# Patient Record
Sex: Female | Born: 1990 | Hispanic: Yes | Marital: Married | Smoking: Never smoker
Health system: Southern US, Community
[De-identification: ages and names within clinical notes are randomized; demographics above are authoritative.]

## PROBLEM LIST (undated history)

## (undated) DIAGNOSIS — K219 Gastro-esophageal reflux disease without esophagitis: Secondary | ICD-10-CM

## (undated) DIAGNOSIS — Z789 Other specified health status: Secondary | ICD-10-CM

## (undated) DIAGNOSIS — K2970 Gastritis, unspecified, without bleeding: Secondary | ICD-10-CM

## (undated) HISTORY — PX: NO PAST SURGERIES: SHX2092

---

## 2016-09-21 LAB — OB RESULTS CONSOLE HIV ANTIBODY (ROUTINE TESTING): HIV: NONREACTIVE

## 2016-09-21 LAB — OB RESULTS CONSOLE RUBELLA ANTIBODY, IGM: RUBELLA: IMMUNE

## 2016-09-21 LAB — OB RESULTS CONSOLE GC/CHLAMYDIA
CHLAMYDIA, DNA PROBE: NEGATIVE
Gonorrhea: NEGATIVE

## 2016-09-21 LAB — OB RESULTS CONSOLE VARICELLA ZOSTER ANTIBODY, IGG: VARICELLA IGG: IMMUNE

## 2016-09-21 LAB — OB RESULTS CONSOLE HEPATITIS B SURFACE ANTIGEN: Hepatitis B Surface Ag: NEGATIVE

## 2016-09-21 LAB — OB RESULTS CONSOLE RPR: RPR: NONREACTIVE

## 2016-10-25 ENCOUNTER — Other Ambulatory Visit: Payer: Self-pay | Admitting: Family Medicine

## 2016-10-25 DIAGNOSIS — Z3480 Encounter for supervision of other normal pregnancy, unspecified trimester: Secondary | ICD-10-CM

## 2016-11-09 ENCOUNTER — Ambulatory Visit
Admission: RE | Admit: 2016-11-09 | Discharge: 2016-11-09 | Disposition: A | Discharge status: Home / Self Care | Payer: No Typology Code available for payment source | Source: Ambulatory Visit | Attending: Family Medicine | Admitting: Family Medicine

## 2016-11-09 DIAGNOSIS — Z3480 Encounter for supervision of other normal pregnancy, unspecified trimester: Secondary | ICD-10-CM

## 2016-11-09 DIAGNOSIS — Z3689 Encounter for other specified antenatal screening: Secondary | ICD-10-CM | POA: Diagnosis present

## 2016-11-09 DIAGNOSIS — Z3A18 18 weeks gestation of pregnancy: Secondary | ICD-10-CM | POA: Insufficient documentation

## 2016-11-09 DIAGNOSIS — O321XX0 Maternal care for breech presentation, not applicable or unspecified: Secondary | ICD-10-CM | POA: Insufficient documentation

## 2016-11-11 ENCOUNTER — Encounter: Payer: Self-pay | Admitting: *Deleted

## 2016-11-11 ENCOUNTER — Emergency Department: Payer: No Typology Code available for payment source

## 2016-11-11 ENCOUNTER — Emergency Department
Admission: EM | Admit: 2016-11-11 | Discharge: 2016-11-11 | Disposition: A | Discharge status: Home / Self Care | Payer: No Typology Code available for payment source | Attending: Emergency Medicine | Admitting: Emergency Medicine

## 2016-11-11 DIAGNOSIS — Z3A19 19 weeks gestation of pregnancy: Secondary | ICD-10-CM | POA: Diagnosis not present

## 2016-11-11 DIAGNOSIS — G44319 Acute post-traumatic headache, not intractable: Secondary | ICD-10-CM | POA: Insufficient documentation

## 2016-11-11 DIAGNOSIS — Y9389 Activity, other specified: Secondary | ICD-10-CM | POA: Insufficient documentation

## 2016-11-11 DIAGNOSIS — Y92410 Unspecified street and highway as the place of occurrence of the external cause: Secondary | ICD-10-CM | POA: Insufficient documentation

## 2016-11-11 DIAGNOSIS — S301XXA Contusion of abdominal wall, initial encounter: Secondary | ICD-10-CM | POA: Diagnosis not present

## 2016-11-11 DIAGNOSIS — Z79899 Other long term (current) drug therapy: Secondary | ICD-10-CM | POA: Insufficient documentation

## 2016-11-11 DIAGNOSIS — S161XXA Strain of muscle, fascia and tendon at neck level, initial encounter: Secondary | ICD-10-CM | POA: Insufficient documentation

## 2016-11-11 DIAGNOSIS — O9989 Other specified diseases and conditions complicating pregnancy, childbirth and the puerperium: Secondary | ICD-10-CM | POA: Insufficient documentation

## 2016-11-11 DIAGNOSIS — Y998 Other external cause status: Secondary | ICD-10-CM | POA: Diagnosis not present

## 2016-11-11 DIAGNOSIS — R1030 Lower abdominal pain, unspecified: Secondary | ICD-10-CM
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
## 2017-01-16 ENCOUNTER — Other Ambulatory Visit: Payer: Self-pay | Admitting: Advanced Practice Midwife

## 2017-01-16 DIAGNOSIS — Z3483 Encounter for supervision of other normal pregnancy, third trimester: Secondary | ICD-10-CM

## 2017-01-24 ENCOUNTER — Ambulatory Visit: Payer: Self-pay

## 2017-01-31 ENCOUNTER — Ambulatory Visit
Admission: RE | Admit: 2017-01-31 | Discharge: 2017-01-31 | Disposition: A | Discharge status: Home / Self Care | Payer: Self-pay | Source: Ambulatory Visit | Attending: Advanced Practice Midwife | Admitting: Advanced Practice Midwife

## 2017-01-31 DIAGNOSIS — Z3A30 30 weeks gestation of pregnancy: Secondary | ICD-10-CM | POA: Insufficient documentation

## 2017-01-31 DIAGNOSIS — Z3483 Encounter for supervision of other normal pregnancy, third trimester: Secondary | ICD-10-CM

## 2017-01-31 DIAGNOSIS — Z3403 Encounter for supervision of normal first pregnancy, third trimester: Secondary | ICD-10-CM | POA: Insufficient documentation

## 2017-03-14 LAB — OB RESULTS CONSOLE GBS: GBS: POSITIVE

## 2017-04-05 ENCOUNTER — Other Ambulatory Visit: Payer: Self-pay | Admitting: Obstetrics & Gynecology

## 2017-04-07 ENCOUNTER — Inpatient Hospital Stay
Admission: EM | Admit: 2017-04-07 | Discharge: 2017-04-13 | DRG: 786 | Disposition: A | Discharge status: Home / Self Care | Payer: Medicaid Other | Attending: Obstetrics and Gynecology | Admitting: Obstetrics and Gynecology

## 2017-04-07 ENCOUNTER — Other Ambulatory Visit: Payer: Self-pay

## 2017-04-07 ENCOUNTER — Encounter: Payer: Self-pay | Admitting: *Deleted

## 2017-04-07 DIAGNOSIS — O480 Post-term pregnancy: Secondary | ICD-10-CM | POA: Diagnosis present

## 2017-04-07 DIAGNOSIS — D62 Acute posthemorrhagic anemia: Secondary | ICD-10-CM | POA: Diagnosis not present

## 2017-04-07 DIAGNOSIS — O134 Gestational [pregnancy-induced] hypertension without significant proteinuria, complicating childbirth: Principal | ICD-10-CM | POA: Diagnosis present

## 2017-04-07 DIAGNOSIS — O163 Unspecified maternal hypertension, third trimester: Secondary | ICD-10-CM | POA: Diagnosis present

## 2017-04-07 DIAGNOSIS — O411290 Chorioamnionitis, unspecified trimester, not applicable or unspecified: Secondary | ICD-10-CM

## 2017-04-07 DIAGNOSIS — R112 Nausea with vomiting, unspecified: Secondary | ICD-10-CM

## 2017-04-07 DIAGNOSIS — O411230 Chorioamnionitis, third trimester, not applicable or unspecified: Secondary | ICD-10-CM | POA: Diagnosis present

## 2017-04-07 DIAGNOSIS — Z9889 Other specified postprocedural states: Secondary | ICD-10-CM

## 2017-04-07 DIAGNOSIS — O99824 Streptococcus B carrier state complicating childbirth: Secondary | ICD-10-CM | POA: Diagnosis present

## 2017-04-07 DIAGNOSIS — Z3A39 39 weeks gestation of pregnancy: Secondary | ICD-10-CM

## 2017-04-07 DIAGNOSIS — O9081 Anemia of the puerperium: Secondary | ICD-10-CM | POA: Diagnosis not present

## 2017-04-07 DIAGNOSIS — O368390 Maternal care for abnormalities of the fetal heart rate or rhythm, unspecified trimester, not applicable or unspecified: Secondary | ICD-10-CM

## 2017-04-07 HISTORY — DX: Other specified health status: Z78.9

## 2017-04-07 LAB — CBC
HCT: 33.3 % — ABNORMAL LOW (ref 35.0–47.0)
Hemoglobin: 11.3 g/dL — ABNORMAL LOW (ref 12.0–16.0)
MCH: 27.9 pg (ref 26.0–34.0)
MCHC: 33.9 g/dL (ref 32.0–36.0)
MCV: 82.1 fL (ref 80.0–100.0)
Platelets: 234 10*3/uL (ref 150–440)
RBC: 4.06 MIL/uL (ref 3.80–5.20)
RDW: 15.6 % — AB (ref 11.5–14.5)
WBC: 8.9 10*3/uL (ref 3.6–11.0)

## 2017-04-07 LAB — PROTEIN / CREATININE RATIO, URINE
Creatinine, Urine: 106 mg/dL
PROTEIN CREATININE RATIO: 0.13 mg/mg{creat} (ref 0.00–0.15)
Total Protein, Urine: 14 mg/dL

## 2017-04-07 LAB — COMPREHENSIVE METABOLIC PANEL
ALK PHOS: 173 U/L — AB (ref 38–126)
ALT: 9 U/L — ABNORMAL LOW (ref 14–54)
ANION GAP: 5 (ref 5–15)
AST: 22 U/L (ref 15–41)
Albumin: 2.9 g/dL — ABNORMAL LOW (ref 3.5–5.0)
BUN: 11 mg/dL (ref 6–20)
CALCIUM: 9 mg/dL (ref 8.9–10.3)
CO2: 23 mmol/L (ref 22–32)
Chloride: 103 mmol/L (ref 101–111)
Creatinine, Ser: 0.6 mg/dL (ref 0.44–1.00)
GFR calc non Af Amer: >60 mL/min (ref >60)
Glucose, Bld: 94 mg/dL (ref 65–99)
Potassium: 3.9 mmol/L (ref 3.5–5.1)
SODIUM: 131 mmol/L — AB (ref 135–145)
TOTAL PROTEIN: 6.6 g/dL (ref 6.5–8.1)
Total Bilirubin: 0.2 mg/dL — ABNORMAL LOW (ref 0.3–1.2)

## 2017-04-07 LAB — TYPE AND SCREEN
ABO/RH(D): O POS
ANTIBODY SCREEN: NEGATIVE

## 2017-04-07 MED ORDER — BUTORPHANOL TARTRATE 2 MG/ML IJ SOLN
1.0000 mg | INTRAMUSCULAR | Status: DC | PRN
Start: 2017-04-07 — End: 2017-04-09

## 2017-04-07 MED ORDER — LACTATED RINGERS IV SOLN
INTRAVENOUS | Status: DC
  Administered 2017-04-07 – 2017-04-09 (×5): via INTRAVENOUS

## 2017-04-07 MED ORDER — ACETAMINOPHEN 325 MG PO TABS: 650.0000 mg | ORAL_TABLET | ORAL | Status: DC | PRN

## 2017-04-07 MED ORDER — ACETAMINOPHEN 325 MG PO TABS
650.0000 mg | ORAL_TABLET | ORAL | Status: DC | PRN
  Administered 2017-04-08 – 2017-04-09 (×3): 650 mg via ORAL
  Filled 2017-04-07 (×3): qty 2

## 2017-04-07 MED ORDER — ONDANSETRON HCL 4 MG/2ML IJ SOLN
4.0000 mg | Freq: Four times a day (QID) | INTRAMUSCULAR | Status: DC | PRN
  Administered 2017-04-09: 4 mg via INTRAVENOUS
  Filled 2017-04-07: qty 2

## 2017-04-07 MED ORDER — PENICILLIN G POT IN DEXTROSE 60000 UNIT/ML IV SOLN
3.0000 10*6.[IU] | INTRAVENOUS | Status: DC
  Administered 2017-04-08 – 2017-04-09 (×9): 3 10*6.[IU] via INTRAVENOUS
  Filled 2017-04-07 (×21): qty 50

## 2017-04-07 MED ORDER — MISOPROSTOL 25 MCG QUARTER TABLET
25.0000 ug | ORAL_TABLET | ORAL | Status: DC | PRN
  Administered 2017-04-07 – 2017-04-08 (×2): 25 ug via VAGINAL
  Filled 2017-04-07 (×3): qty 1

## 2017-04-07 MED ORDER — OXYTOCIN 40 UNITS IN LACTATED RINGERS INFUSION - SIMPLE MED
2.5000 [IU]/h | INTRAVENOUS | Status: DC
  Filled 2017-04-07: qty 1000

## 2017-04-07 MED ORDER — LACTATED RINGERS IV SOLN
500.0000 mL | INTRAVENOUS | Status: DC | PRN
  Administered 2017-04-09 (×2): 500 mL via INTRAVENOUS

## 2017-04-07 MED ORDER — PENICILLIN G POTASSIUM 5000000 UNITS IJ SOLR
5.0000 10*6.[IU] | Freq: Once | INTRAVENOUS | Status: AC
  Administered 2017-04-07: 5 10*6.[IU] via INTRAVENOUS
  Filled 2017-04-07: qty 5

## 2017-04-07 MED ORDER — OXYTOCIN BOLUS FROM INFUSION: 500.0000 mL | Freq: Once | INTRAVENOUS | Status: DC

## 2017-04-07 MED ORDER — TERBUTALINE SULFATE 1 MG/ML IJ SOLN: 0.2500 mg | Freq: Once | INTRAMUSCULAR | Status: DC | PRN

## 2017-04-07 NOTE — H&P (Signed)
Obstetric H&P   Chief Complaint: Contractions  Prenatal Care Provider: [facility]  History of Present Illness: 26 y.o. G1P0 4895w6d by 18 week US derived EDD of 04/08/2017, presenting to L&D with irregular contractions, noted to have mild range BP's on presentation.  Patient was not found to be in labor at the time of admission, asymptomatic from BP standpoint without headaches, vision changes, RUQ or epigastric pain.  Does make note on her prenatal records of excessive weight gain of 50lbs during this pregnancy.  +FM, no LOF, no VB.   I have reviewed the dating for the pregnancy, and the dating was changed based on results of a 18 week ultrasound.  However, the discrepancy between the ultrasound and LMP was less than 7 days.  Which would make the patient 39 weeks but does not affect management.  US Criteria for Re-dating Pregnancy  Dating Dating Discrepancy  1368w6d >5 days  2670w0d - 5361w6d >7 days  7334w0d - 7472w6d >7 days  425w0d - 2629w6d >10 days  374w0d - 7334w6d >14 days  6174w0d and above >21 days   ACOG Committee Opinion 700 May 2017 "Methods for Estimating the Due Date"   Clinic [facility] Prenatal Labs  Dating  Blood type: O positive  Genetic Screen Quad:  Negative Antibody:Negative  Anatomic US  Rubella: Immune (05/04 0000) Varicella: Immune  GTT Early: 88; 28wk: 109 RPR: Nonreactive (05/04 0000)   Rhogam N/A HBsAg: Negative (05/04 0000)   TDaP vaccine 01/15/2017  Flu Shot:02/26/2017 HIV: Non-reactive (05/04 0000)   Baby Food Breast                              UJW:[id number]:Positive (10/25 0000)  Contraception Undecided Pap:no record  Support Person  Baseline P/C ratio 226     Review of Systems: 10 point review of systems negative unless otherwise noted in HPI  Past Medical History: Past Medical History:  Diagnosis Date  . Medical history non-contributory     Past Surgical History: Past Surgical History:  Procedure Laterality Date  . NO PAST SURGERIES      Family History: History  reviewed. No pertinent family history.  Social History: Social History   Socioeconomic History  . Marital status: Single    Spouse name: Not on file  . Number of children: Not on file  . Years of education: Not on file  . Highest education level: Not on file  Social Needs  . Financial resource strain: Not on file  . Food insecurity - worry: Not on file  . Food insecurity - inability: Not on file  . Transportation needs - medical: Not on file  . Transportation needs - non-medical: Not on file  Occupational History  . Not on file  Tobacco Use  . Smoking status: Never Smoker  . Smokeless tobacco: Never Used  Substance and Sexual Activity  . Alcohol use: No  . Drug use: No  . Sexual activity: Not on file  Other Topics Concern  . Not on file  Social History Narrative  . Not on file    Medications: Prior to Admission medications   Medication Sig Start Date End Date Taking? Authorizing Provider  acetaminophen (TYLENOL) 325 MG tablet Take 325 mg every 6 (six) hours as needed by mouth for mild pain.   Yes [provider]  Prenatal Vit-Fe Fumarate-FA (MULTIVITAMIN-PRENATAL) 27-0.8 MG TABS tablet Take 1 tablet by mouth daily at 12 noon.   Yes [provider]    Allergies: No Known Allergies  Physical Exam: Vitals: Blood pressure (!) 145/92, pulse 96, temperature 98.4 F (36.9 C), temperature source Oral, resp. rate 18, height 5\' 3"  (1.6 m), weight 213 lb (96.6 kg).  Urine Dip Protein: P?C pending  FHT: 120, moderate, +accels, no decels Toco: irritability  General: NAD HEENT: normocephalic, anicteric Pulmonary: No increased work of breathing Cardiovascular: RRR, distal pulses 2+ Abdomen: Gravid, non-tender Leopolds: vtx 8lbs Genitourinary:  Dilation: (posterior) Cervical Position: Posterior Presentation: Undeterminable Exam by:: LSE Extremities: no edema, erythema, or tenderness Neurologic: Grossly intact Psychiatric: mood appropriate, affect  full  Labs: Results for orders placed or performed during the hospital encounter of 04/07/17 (from the past 24 hour(s))  Protein / creatinine ratio, urine     Status: None   Collection Time: 04/07/17  7:57 PM  Result Value Ref Range   Creatinine, Urine 106 mg/dL   Total Protein, Urine 14 mg/dL   Protein Creatinine Ratio 0.13 0.00 - 0.15 mg/mg[Cre]    Assessment: 26 y.o. G1P0 9435w6d by 04/08/2017, with gestational hypertension  Plan: 1) Gestational hypertension at greater than 37 weeks - proceed with cytotec IOL  2) Fetus -  Cat I tracing  3) PNL - see HPI  4) Immunization History - TDAP and influenza up to date  5) Disposition - pending delivery

## 2017-04-08 ENCOUNTER — Inpatient Hospital Stay: Payer: Medicaid Other | Admitting: Anesthesiology

## 2017-04-08 ENCOUNTER — Encounter: Payer: Self-pay | Admitting: Anesthesiology

## 2017-04-08 MED ORDER — MISOPROSTOL 200 MCG PO TABS
ORAL_TABLET | ORAL | Status: AC
  Filled 2017-04-08: qty 4

## 2017-04-08 MED ORDER — DIPHENHYDRAMINE HCL 50 MG/ML IJ SOLN: 12.5000 mg | INTRAMUSCULAR | Status: DC | PRN

## 2017-04-08 MED ORDER — FENTANYL 2.5 MCG/ML W/ROPIVACAINE 0.15% IN NS 100 ML EPIDURAL (ARMC)
EPIDURAL | Status: DC | PRN
  Administered 2017-04-08: 12 mL/h via EPIDURAL

## 2017-04-08 MED ORDER — EPHEDRINE 5 MG/ML INJ: 10.0000 mg | INTRAVENOUS | Status: DC | PRN

## 2017-04-08 MED ORDER — LIDOCAINE HCL (PF) 1 % IJ SOLN
INTRAMUSCULAR | Status: AC
  Filled 2017-04-08: qty 30

## 2017-04-08 MED ORDER — OXYTOCIN 10 UNIT/ML IJ SOLN
INTRAMUSCULAR | Status: AC
  Filled 2017-04-08: qty 2

## 2017-04-08 MED ORDER — OXYTOCIN 40 UNITS IN LACTATED RINGERS INFUSION - SIMPLE MED
1.0000 m[IU]/min | INTRAVENOUS | Status: DC
  Administered 2017-04-08: 2 m[IU]/min via INTRAVENOUS
  Filled 2017-04-08: qty 1000

## 2017-04-08 MED ORDER — LIDOCAINE HCL (PF) 1 % IJ SOLN
INTRAMUSCULAR | Status: DC | PRN
  Administered 2017-04-08: 2 mL via SUBCUTANEOUS

## 2017-04-08 MED ORDER — PHENYLEPHRINE 40 MCG/ML (10ML) SYRINGE FOR IV PUSH (FOR BLOOD PRESSURE SUPPORT): 80.0000 ug | PREFILLED_SYRINGE | INTRAVENOUS | Status: DC | PRN

## 2017-04-08 MED ORDER — FENTANYL 2.5 MCG/ML W/ROPIVACAINE 0.15% IN NS 100 ML EPIDURAL (ARMC)
12.0000 mL/h | EPIDURAL | Status: DC
  Administered 2017-04-09 (×2): 12 mL/h via EPIDURAL
  Filled 2017-04-08 (×2): qty 100

## 2017-04-08 MED ORDER — AMMONIA AROMATIC IN INHA
RESPIRATORY_TRACT | Status: AC
  Filled 2017-04-08: qty 10

## 2017-04-08 MED ORDER — FENTANYL 2.5 MCG/ML W/ROPIVACAINE 0.15% IN NS 100 ML EPIDURAL (ARMC)
EPIDURAL | Status: AC
  Filled 2017-04-08: qty 100

## 2017-04-08 MED ORDER — BUPIVACAINE HCL (PF) 0.25 % IJ SOLN
INTRAMUSCULAR | Status: DC | PRN
  Administered 2017-04-08 (×2): 5 mL via EPIDURAL

## 2017-04-08 MED ORDER — TERBUTALINE SULFATE 1 MG/ML IJ SOLN: 0.2500 mg | Freq: Once | INTRAMUSCULAR | Status: DC | PRN

## 2017-04-08 MED ORDER — LIDOCAINE-EPINEPHRINE (PF) 1.5 %-1:200000 IJ SOLN
INTRAMUSCULAR | Status: DC | PRN
  Administered 2017-04-08: 3 mL via EPIDURAL

## 2017-04-08 MED ORDER — LACTATED RINGERS IV SOLN: 500.0000 mL | Freq: Once | INTRAVENOUS | Status: DC

## 2017-04-08 NOTE — Progress Notes (Signed)
Pt called out because she was leaking fluid when sitting on birthing ball. Nitrazine negative.

## 2017-04-08 NOTE — Progress Notes (Signed)
SVE performed. Updated pt on POC. Denies additional questions/needs at this time. Communication via Kandis CockingMaritza, Spanish interpretor.

## 2017-04-08 NOTE — Anesthesia Preprocedure Evaluation (Signed)
Anesthesia Evaluation  Patient identified by MRN, date of birth, ID band Patient awake    Reviewed: Allergy & Precautions, H&P , NPO status , Patient's Chart, lab work & pertinent test results, reviewed documented beta blocker date and time   History of Anesthesia Complications Negative for: history of anesthetic complications  Airway Mallampati: III  TM Distance: >3 FB Neck ROM: full    Dental  (+) Teeth Intact   Pulmonary neg pulmonary ROS,           Cardiovascular Exercise Tolerance: Good negative cardio ROS       Neuro/Psych negative neurological ROS  negative psych ROS   GI/Hepatic Neg liver ROS, GERD  ,  Endo/Other  negative endocrine ROS  Renal/GU negative Renal ROS  negative genitourinary   Musculoskeletal   Abdominal   Peds  Hematology negative hematology ROS (+)   Anesthesia Other Findings Past Medical History: No date: Medical history non-contributory   Reproductive/Obstetrics (+) Pregnancy                             Anesthesia Physical Anesthesia Plan  ASA: II  Anesthesia Plan: Epidural   Post-op Pain Management:    Induction:   PONV Risk Score and Plan:   Airway Management Planned:   Additional Equipment:   Intra-op Plan:   Post-operative Plan:   Informed Consent: I have reviewed the patients History and Physical, chart, labs and discussed the procedure including the risks, benefits and alternatives for the proposed anesthesia with the patient or authorized representative who has indicated his/her understanding and acceptance.   Dental Advisory Given  Plan Discussed with: Anesthesiologist, CRNA and Surgeon  Anesthesia Plan Comments:         Anesthesia Quick Evaluation

## 2017-04-08 NOTE — Progress Notes (Signed)
Introduced self to pt and  significant other and updated on plan of care for the day via [place] interpretor, StevensonLoyda. Pt demonstrated understanding of how to contact nursing staff (call bell and telephone), verbalized understanding of plan of care, and denied questions at this time.

## 2017-04-08 NOTE — Anesthesia Procedure Notes (Signed)
Epidural Patient location during procedure: OB Start time: 04/08/2017 10:53 PM End time: 04/08/2017 11:02 PM  Staffing Anesthesiologist: Lenard SimmerKarenz, Elisa Sorlie, MD Performed: anesthesiologist   Preanesthetic Checklist Completed: patient identified, site marked, surgical consent, pre-op evaluation, timeout performed, IV checked, risks and benefits discussed and monitors and equipment checked  Epidural Patient position: sitting Prep: ChloraPrep Patient monitoring: heart rate, continuous pulse ox and blood pressure Approach: midline Location: L3-L4 Injection technique: LOR saline  Needle:  Needle type: Tuohy  Needle gauge: 17 G Needle length: 9 cm and 9 Needle insertion depth: 5 cm Catheter type: closed end flexible Catheter size: 19 Gauge Catheter at skin depth: 10 cm Test dose: negative and 1.5% lidocaine with Epi 1:200 K  Assessment Sensory level: T10 Events: blood not aspirated, injection not painful, no injection resistance, negative IV test and no paresthesia  Additional Notes Pt. Evaluated and documentation done after procedure finished. Patient identified. Risks/Benefits/Options discussed with patient including but not limited to bleeding, infection, nerve damage, paralysis, failed block, incomplete pain control, headache, blood pressure changes, nausea, vomiting, reactions to medication both or allergic, itching and postpartum back pain. Confirmed with bedside nurse the patient's most recent platelet count. Confirmed with patient that they are not currently taking any anticoagulation, have any bleeding history or any family history of bleeding disorders. Patient expressed understanding and wished to proceed. All questions were answered. Sterile technique was used throughout the entire procedure. Please see nursing notes for vital signs. Test dose was given through epidural catheter and negative prior to continuing to dose epidural or start infusion. Warning signs of high block given  to the patient including shortness of breath, tingling/numbness in hands, complete motor block, or any concerning symptoms with instructions to call for help. Patient was given instructions on fall risk and not to get out of bed. All questions and concerns addressed with instructions to call with any issues or inadequate analgesia.   Patient tolerated the insertion well without immediate complications.Reason for block:procedure for pain

## 2017-04-08 NOTE — Progress Notes (Signed)
  Labor Progress Note   26 y.o. G1P0 @ 5058w0d , admitted for  Pregnancy, Labor Management. Induction for gestational hypertension  Subjective:  The contractions are feeling stronger.   Objective:  BP 131/78 (BP Location: Right Arm)   Pulse 88   Temp 98.7 F (37.1 C) (Axillary)   Resp 20   Ht 5\' 3"  (1.6 m)   Wt 213 lb (96.6 kg)   BMI 37.73 kg/m  Abd: mild Extr: trace to 1+ bilateral pedal edema SVE: CERVIX: 1.5 cm dilated, 70-80 effaced, -3 station, cervix swept, foley bulb placed  EFM: FHR: 130 bpm, variability: moderate,  accelerations:  Present,  decelerations:  Absent Toco: Frequency: Every 2-4 minutes Labs: I have reviewed the patient's lab results.   Assessment & Plan:  G1P0 @ 9358w0d, admitted for  Pregnancy and Labor/Delivery Management  1. Pain management: none. 2. FWB: FHT category I.  3. ID: GBS positive 4. Labor management: foley bulb, sweep, continue pitocin  All discussed with patient through interpreter, see orders  Tresea MallJane Zilpha Mcandrew, CNM  [facility], [facility] 04/08/2017  2:48 PM

## 2017-04-08 NOTE — Progress Notes (Signed)
  Labor Progress Note   26 y.o. G1P0 @ 629w0d , admitted for  Pregnancy, Labor Management. Induction for gestational hypertension  Subjective:  Coping well with contractions- rating pain 8/10  Objective:  BP 135/70 (BP Location: Right Arm)   Pulse 84   Temp 98.3 F (36.8 C) (Oral)   Resp 18   Ht 5\' 3"  (1.6 m)   Wt 213 lb (96.6 kg)   BMI 37.73 kg/m  Abd: mild Extr: trace to 1+ bilateral pedal edema SVE per RN E. Rivera: CERVIX: 2.5 cm dilated, 80 effaced, -3 station  EFM: FHR: 125 bpm, variability: moderate,  accelerations:  Present,  decelerations:  Absent Toco: Frequency: Every 2-3 minutes Labs: I have reviewed the patient's lab results.   Assessment & Plan:  G1P0 @ 1229w0d, admitted for  Pregnancy and Labor/Delivery Management  1. Pain management: none. 2. FWB: FHT category I.  3. ID: GBS positive Penicillin prophylaxis 4. Labor management: Continue with pitocin, AROM with lower station  All discussed with patient, see orders  Tresea MallJane Madinah Quarry, CNM  [facility], [facility] 04/08/2017  7:41 PM

## 2017-04-09 ENCOUNTER — Other Ambulatory Visit: Payer: Self-pay

## 2017-04-09 ENCOUNTER — Encounter
Admission: EM | Disposition: A | Discharge status: Home / Self Care | Payer: Self-pay | Source: Home / Self Care | Attending: Obstetrics and Gynecology

## 2017-04-09 DIAGNOSIS — O368390 Maternal care for abnormalities of the fetal heart rate or rhythm, unspecified trimester, not applicable or unspecified: Secondary | ICD-10-CM

## 2017-04-09 DIAGNOSIS — O134 Gestational [pregnancy-induced] hypertension without significant proteinuria, complicating childbirth: Secondary | ICD-10-CM

## 2017-04-09 DIAGNOSIS — O411290 Chorioamnionitis, unspecified trimester, not applicable or unspecified: Secondary | ICD-10-CM

## 2017-04-09 DIAGNOSIS — Z3A39 39 weeks gestation of pregnancy: Secondary | ICD-10-CM

## 2017-04-09 HISTORY — DX: Chorioamnionitis, unspecified trimester, not applicable or unspecified: O41.1290

## 2017-04-09 LAB — RPR: RPR Ser Ql: NONREACTIVE

## 2017-04-09 LAB — COMPREHENSIVE METABOLIC PANEL
ALBUMIN: 2.5 g/dL — AB (ref 3.5–5.0)
ALK PHOS: 187 U/L — AB (ref 38–126)
ALT: 9 U/L — AB (ref 14–54)
ANION GAP: 9 (ref 5–15)
AST: 20 U/L (ref 15–41)
BUN: 9 mg/dL (ref 6–20)
CALCIUM: 9 mg/dL (ref 8.9–10.3)
CHLORIDE: 102 mmol/L (ref 101–111)
CO2: 21 mmol/L — AB (ref 22–32)
CREATININE: 0.57 mg/dL (ref 0.44–1.00)
GFR calc Af Amer: >60 mL/min (ref >60)
GFR calc non Af Amer: >60 mL/min (ref >60)
GLUCOSE: 80 mg/dL (ref 65–99)
Potassium: 4.1 mmol/L (ref 3.5–5.1)
SODIUM: 132 mmol/L — AB (ref 135–145)
Total Bilirubin: 0.5 mg/dL (ref 0.3–1.2)
Total Protein: 6.2 g/dL — ABNORMAL LOW (ref 6.5–8.1)

## 2017-04-09 LAB — CBC
HCT: 34.6 % — ABNORMAL LOW (ref 35.0–47.0)
HEMOGLOBIN: 11.2 g/dL — AB (ref 12.0–16.0)
MCH: 27.7 pg (ref 26.0–34.0)
MCHC: 32.4 g/dL (ref 32.0–36.0)
MCV: 85.3 fL (ref 80.0–100.0)
Platelets: 217 10*3/uL (ref 150–440)
RBC: 4.05 MIL/uL (ref 3.80–5.20)
RDW: 17.1 % — ABNORMAL HIGH (ref 11.5–14.5)
WBC: 8.4 10*3/uL (ref 3.6–11.0)

## 2017-04-09 SURGERY — Surgical Case
Anesthesia: Choice

## 2017-04-09 MED ORDER — OXYTOCIN 40 UNITS IN LACTATED RINGERS INFUSION - SIMPLE MED
2.5000 [IU]/h | INTRAVENOUS | Status: AC
  Filled 2017-04-09: qty 1000

## 2017-04-09 MED ORDER — SUGAMMADEX SODIUM 200 MG/2ML IV SOLN
INTRAVENOUS | Status: DC | PRN
  Administered 2017-04-09: 200 mg via INTRAVENOUS

## 2017-04-09 MED ORDER — SODIUM CHLORIDE 0.9 % IJ SOLN
INTRAMUSCULAR | Status: AC
  Filled 2017-04-09: qty 50

## 2017-04-09 MED ORDER — OXYTOCIN 10 UNIT/ML IJ SOLN
INTRAMUSCULAR | Status: AC
  Filled 2017-04-09: qty 6

## 2017-04-09 MED ORDER — MENTHOL 3 MG MT LOZG
1.0000 | LOZENGE | OROMUCOSAL | Status: DC | PRN
  Filled 2017-04-09: qty 9

## 2017-04-09 MED ORDER — MIDAZOLAM HCL 2 MG/2ML IJ SOLN
INTRAMUSCULAR | Status: AC
  Filled 2017-04-09: qty 2

## 2017-04-09 MED ORDER — CEFAZOLIN SODIUM-DEXTROSE 2-4 GM/100ML-% IV SOLN: 2.0000 g | INTRAVENOUS | Status: DC

## 2017-04-09 MED ORDER — EPHEDRINE SULFATE 50 MG/ML IJ SOLN
INTRAMUSCULAR | Status: DC | PRN
  Administered 2017-04-09: 10 mg via INTRAVENOUS

## 2017-04-09 MED ORDER — FENTANYL CITRATE (PF) 250 MCG/5ML IJ SOLN
INTRAMUSCULAR | Status: AC
  Filled 2017-04-09: qty 5

## 2017-04-09 MED ORDER — SODIUM CHLORIDE 0.9 % IJ SOLN
INTRAMUSCULAR | Status: DC | PRN
  Administered 2017-04-09: 50 mL via INTRAVENOUS

## 2017-04-09 MED ORDER — SIMETHICONE 80 MG PO CHEW
80.0000 mg | CHEWABLE_TABLET | Freq: Three times a day (TID) | ORAL | Status: DC
  Administered 2017-04-10 – 2017-04-13 (×11): 80 mg via ORAL
  Filled 2017-04-09 (×11): qty 1

## 2017-04-09 MED ORDER — FENTANYL CITRATE (PF) 100 MCG/2ML IJ SOLN
INTRAMUSCULAR | Status: AC
  Filled 2017-04-09: qty 2

## 2017-04-09 MED ORDER — CARBOPROST TROMETHAMINE 250 MCG/ML IM SOLN
INTRAMUSCULAR | Status: AC
  Administered 2017-04-09: 250 ug
  Filled 2017-04-09: qty 1

## 2017-04-09 MED ORDER — MORPHINE SULFATE (PF) 0.5 MG/ML IJ SOLN
INTRAMUSCULAR | Status: AC
  Filled 2017-04-09: qty 10

## 2017-04-09 MED ORDER — OXYTOCIN 40 UNITS IN LACTATED RINGERS INFUSION - SIMPLE MED
INTRAVENOUS | Status: DC | PRN
  Administered 2017-04-09: 1 mL via INTRAVENOUS
  Administered 2017-04-09: 299 mL via INTRAVENOUS
  Administered 2017-04-09: 500 mL via INTRAVENOUS

## 2017-04-09 MED ORDER — ZOLPIDEM TARTRATE 5 MG PO TABS: 5.0000 mg | ORAL_TABLET | Freq: Every evening | ORAL | Status: DC | PRN

## 2017-04-09 MED ORDER — ACETAMINOPHEN 325 MG PO TABS
650.0000 mg | ORAL_TABLET | ORAL | Status: DC | PRN
  Administered 2017-04-11 – 2017-04-12 (×3): 650 mg via ORAL
  Filled 2017-04-09 (×3): qty 2

## 2017-04-09 MED ORDER — OXYCODONE-ACETAMINOPHEN 5-325 MG PO TABS
2.0000 | ORAL_TABLET | ORAL | Status: DC | PRN
  Administered 2017-04-10 – 2017-04-11 (×5): 2 via ORAL
  Filled 2017-04-09 (×4): qty 2

## 2017-04-09 MED ORDER — BUPIVACAINE HCL (PF) 0.5 % IJ SOLN
INTRAMUSCULAR | Status: DC | PRN
  Administered 2017-04-09: 5 mL

## 2017-04-09 MED ORDER — LACTATED RINGERS IV SOLN
INTRAVENOUS | Status: DC
  Administered 2017-04-09 – 2017-04-10 (×3): via INTRAVENOUS

## 2017-04-09 MED ORDER — ACETAMINOPHEN 500 MG PO TABS
ORAL_TABLET | ORAL | Status: AC
  Filled 2017-04-09: qty 2

## 2017-04-09 MED ORDER — SENNOSIDES-DOCUSATE SODIUM 8.6-50 MG PO TABS
2.0000 | ORAL_TABLET | ORAL | Status: DC
  Administered 2017-04-10 – 2017-04-13 (×4): 2 via ORAL
  Filled 2017-04-09 (×4): qty 2

## 2017-04-09 MED ORDER — BUPIVACAINE HCL (PF) 0.5 % IJ SOLN
INTRAMUSCULAR | Status: AC
  Administered 2017-04-09: 20:00:00
  Filled 2017-04-09: qty 30

## 2017-04-09 MED ORDER — GENTAMICIN SULFATE 40 MG/ML IJ SOLN
5.0000 mg/kg | INTRAVENOUS | Status: DC
  Administered 2017-04-09: 480 mg via INTRAVENOUS
  Filled 2017-04-09: qty 12

## 2017-04-09 MED ORDER — FLEET ENEMA 7-19 GM/118ML RE ENEM: 1.0000 | ENEMA | Freq: Every day | RECTAL | Status: DC | PRN

## 2017-04-09 MED ORDER — OXYCODONE-ACETAMINOPHEN 5-325 MG PO TABS
1.0000 | ORAL_TABLET | ORAL | Status: DC | PRN
  Filled 2017-04-09 (×2): qty 1

## 2017-04-09 MED ORDER — IBUPROFEN 600 MG PO TABS
600.0000 mg | ORAL_TABLET | Freq: Four times a day (QID) | ORAL | Status: DC
  Administered 2017-04-09 – 2017-04-13 (×15): 600 mg via ORAL
  Filled 2017-04-09 (×15): qty 1

## 2017-04-09 MED ORDER — ROCURONIUM BROMIDE 100 MG/10ML IV SOLN
INTRAVENOUS | Status: DC | PRN
  Administered 2017-04-09: 10 mg via INTRAVENOUS

## 2017-04-09 MED ORDER — FENTANYL CITRATE (PF) 100 MCG/2ML IJ SOLN
25.0000 ug | INTRAMUSCULAR | Status: DC | PRN
  Administered 2017-04-09 (×4): 25 ug via INTRAVENOUS

## 2017-04-09 MED ORDER — LACTATED RINGERS IV SOLN
INTRAVENOUS | Status: DC | PRN
  Administered 2017-04-09: 20:00:00 via INTRAVENOUS

## 2017-04-09 MED ORDER — SODIUM CHLORIDE FLUSH 0.9 % IV SOLN
INTRAVENOUS | Status: AC
Start: 2017-04-09 — End: 2017-04-09
  Administered 2017-04-09: 10:00:00
  Filled 2017-04-09: qty 10

## 2017-04-09 MED ORDER — ONDANSETRON HCL 4 MG/2ML IJ SOLN: 4.0000 mg | Freq: Once | INTRAMUSCULAR | Status: DC | PRN

## 2017-04-09 MED ORDER — WITCH HAZEL-GLYCERIN EX PADS: 1.0000 "application " | MEDICATED_PAD | CUTANEOUS | Status: DC | PRN

## 2017-04-09 MED ORDER — SIMETHICONE 80 MG PO CHEW: 80.0000 mg | CHEWABLE_TABLET | ORAL | Status: DC | PRN

## 2017-04-09 MED ORDER — MISOPROSTOL 200 MCG PO TABS
ORAL_TABLET | ORAL | Status: AC
  Administered 2017-04-09: 23:00:00
  Filled 2017-04-09: qty 4

## 2017-04-09 MED ORDER — METHYLERGONOVINE MALEATE 0.2 MG/ML IJ SOLN
INTRAMUSCULAR | Status: AC
  Filled 2017-04-09: qty 1

## 2017-04-09 MED ORDER — SOD CITRATE-CITRIC ACID 500-334 MG/5ML PO SOLN
ORAL | Status: AC
  Administered 2017-04-09: 23:00:00
  Filled 2017-04-09: qty 15

## 2017-04-09 MED ORDER — SIMETHICONE 80 MG PO CHEW
80.0000 mg | CHEWABLE_TABLET | ORAL | Status: DC
  Administered 2017-04-11: 80 mg via ORAL
  Filled 2017-04-09: qty 1

## 2017-04-09 MED ORDER — PRENATAL MULTIVITAMIN CH
1.0000 | ORAL_TABLET | Freq: Every day | ORAL | Status: DC
  Administered 2017-04-10 – 2017-04-13 (×4): 1 via ORAL
  Filled 2017-04-09 (×4): qty 1

## 2017-04-09 MED ORDER — ONDANSETRON HCL 4 MG/2ML IJ SOLN
INTRAMUSCULAR | Status: DC | PRN
  Administered 2017-04-09: 4 mg via INTRAVENOUS

## 2017-04-09 MED ORDER — FENTANYL CITRATE (PF) 100 MCG/2ML IJ SOLN
INTRAMUSCULAR | Status: AC
Start: 2017-04-09 — End: 2017-04-09
  Administered 2017-04-09: 25 ug via INTRAVENOUS
  Filled 2017-04-09: qty 2

## 2017-04-09 MED ORDER — BISACODYL 10 MG RE SUPP
10.0000 mg | Freq: Every day | RECTAL | Status: DC | PRN
  Filled 2017-04-09: qty 1

## 2017-04-09 MED ORDER — SODIUM CHLORIDE 0.9 % IV SOLN
2.0000 g | Freq: Four times a day (QID) | INTRAVENOUS | Status: DC
  Administered 2017-04-09: 2 g via INTRAVENOUS
  Filled 2017-04-09 (×4): qty 2000

## 2017-04-09 MED ORDER — METOPROLOL TARTRATE 5 MG/5ML IV SOLN
5.0000 mg | Freq: Once | INTRAVENOUS | Status: AC
  Administered 2017-04-09: 5 mg via INTRAVENOUS

## 2017-04-09 MED ORDER — PHENYLEPHRINE HCL 10 MG/ML IJ SOLN
INTRAMUSCULAR | Status: DC | PRN
  Administered 2017-04-09: 100 ug via INTRAVENOUS

## 2017-04-09 MED ORDER — SOD CITRATE-CITRIC ACID 500-334 MG/5ML PO SOLN: 30.0000 mL | ORAL | Status: DC

## 2017-04-09 MED ORDER — HEPARIN SODIUM (PORCINE) 5000 UNIT/ML IJ SOLN
5000.0000 [IU] | Freq: Three times a day (TID) | INTRAMUSCULAR | Status: DC
  Filled 2017-04-09: qty 1

## 2017-04-09 MED ORDER — DIPHENHYDRAMINE HCL 25 MG PO CAPS: 25.0000 mg | ORAL_CAPSULE | Freq: Four times a day (QID) | ORAL | Status: DC | PRN

## 2017-04-09 MED ORDER — BUPIVACAINE LIPOSOME 1.3 % IJ SUSP: 20.0000 mL | Freq: Once | INTRAMUSCULAR | Status: DC

## 2017-04-09 MED ORDER — METOPROLOL TARTRATE 5 MG/5ML IV SOLN
INTRAVENOUS | Status: AC
  Administered 2017-04-09: 23:00:00
  Filled 2017-04-09: qty 5

## 2017-04-09 MED ORDER — BUPIVACAINE LIPOSOME 1.3 % IJ SUSP
INTRAMUSCULAR | Status: AC
  Administered 2017-04-09: 20:00:00
  Filled 2017-04-09: qty 20

## 2017-04-09 MED ORDER — MORPHINE SULFATE (PF) 0.5 MG/ML IJ SOLN
INTRAMUSCULAR | Status: DC | PRN
  Administered 2017-04-09: 3 mg via EPIDURAL

## 2017-04-09 MED ORDER — ACETAMINOPHEN 500 MG PO TABS
1000.0000 mg | ORAL_TABLET | Freq: Four times a day (QID) | ORAL | Status: DC | PRN
  Administered 2017-04-09: 1000 mg via ORAL

## 2017-04-09 MED ORDER — FENTANYL CITRATE (PF) 100 MCG/2ML IJ SOLN
INTRAMUSCULAR | Status: DC | PRN
  Administered 2017-04-09 (×3): 50 ug via INTRAVENOUS
  Administered 2017-04-09 (×2): 100 ug via INTRAVENOUS

## 2017-04-09 MED ORDER — COCONUT OIL OIL: 1.0000 "application " | TOPICAL_OIL | Status: DC | PRN

## 2017-04-09 MED ORDER — DEXTROSE 5 % IV SOLN
2.0000 g | Freq: Once | INTRAVENOUS | Status: DC
  Filled 2017-04-09: qty 2000

## 2017-04-09 MED ORDER — DIBUCAINE 1 % RE OINT: 1.0000 "application " | TOPICAL_OINTMENT | RECTAL | Status: DC | PRN

## 2017-04-09 MED ORDER — MIDAZOLAM HCL 2 MG/2ML IJ SOLN
INTRAMUSCULAR | Status: DC | PRN
  Administered 2017-04-09: 2 mg via INTRAVENOUS

## 2017-04-09 SURGICAL SUPPLY — 21 items
CANISTER SUCT 3000ML PPV (MISCELLANEOUS) ×3 IMPLANT
COVER LIGHT HANDLE STERIS (MISCELLANEOUS) ×3 IMPLANT
DERMABOND ADVANCED (GAUZE/BANDAGES/DRESSINGS) ×4
DERMABOND ADVANCED .7 DNX12 (GAUZE/BANDAGES/DRESSINGS) ×2 IMPLANT
DRSG OPSITE POSTOP 4X10 (GAUZE/BANDAGES/DRESSINGS) ×6 IMPLANT
ELECT REM PT RETURN 9FT ADLT (ELECTROSURGICAL) ×3
ELECTRODE REM PT RTRN 9FT ADLT (ELECTROSURGICAL) ×1 IMPLANT
GLOVE BIO SURGEON STRL SZ7 (GLOVE) ×3 IMPLANT
GLOVE BIOGEL PI IND STRL 7.5 (GLOVE) ×3 IMPLANT
GLOVE BIOGEL PI INDICATOR 7.5 (GLOVE) ×6
GOWN STRL REUS W/TWL XL LVL4 (GOWN DISPOSABLE) ×9 IMPLANT
NEEDLE HYPO 22GX1.5 SAFETY (NEEDLE) ×3 IMPLANT
NS IRRIG 1000ML POUR BTL (IV SOLUTION) ×3 IMPLANT
PACK C SECTION AR (MISCELLANEOUS) ×9 IMPLANT
SPONGE LAP 18X18 5 PK (GAUZE/BANDAGES/DRESSINGS) ×3 IMPLANT
SUT MNCRL 3 0 RB1 (SUTURE) ×1 IMPLANT
SUT MONOCRYL 3 0 RB1 (SUTURE) ×2
SUT PLAIN 3-0 (SUTURE) ×3 IMPLANT
SUT VIC AB 2-0 CT1 36 (SUTURE) ×3 IMPLANT
SUT VICRYL 0 AB UR-6 (SUTURE) ×9 IMPLANT
SYR 30ML LL (SYRINGE) ×6 IMPLANT

## 2017-04-09 NOTE — Discharge Summary (Signed)
OB Discharge Summary     Patient Name: Emily CraftsBlanca Y Alfaro Cruz DOB: Jun 08, 1990 MRN: [id number]  Date of admission: 04/07/2017 Delivering MD: Natale Milchhristanna R. Schuman, MD Date of Delivery: 04/09/2017  Date of discharge: 04/13/2017   Admitting diagnosis: contractions Intrauterine pregnancy: [redacted]w[redacted]d     Secondary diagnosis: Gestational Hypertension     Discharge diagnosis: Term Pregnancy Delivered, Gestational Hypertension, [facility]                         Hospital course:  Induction of Labor With Cesarean Section  26 y.o. yo G1P1001 at 525w1d was admitted to the hospital 04/07/2017 for induction of labor. Patient had a labor course significant for chorioamnionitis. The patient went for cesarean section due to Non-Reassuring FHR, and delivered a Viable infant, 6:27 AM ,04/09/2017   Details of operation can be found in separate operative Note.  Patient had a postpartum course significant for post-operative nausea/vomiting and anemia. She received 2 units of PRBC and was asymptomatic on discharge. She is ambulating, tolerating a regular diet, passing flatus, and urinating well.  Patient is discharged home in stable condition on 04/13/17.                                                                                              Post partum procedures: blood transfusion  Complications: Intrauterine Inflammation or infection (Chorioamniotis)  Physical exam on 04/16/2017: Vitals:   04/13/17 0757 04/13/17 1203 04/13/17 1616 04/13/17 1636  BP: (!) 142/83 137/85 (!) 152/92 (!) 145/92  Pulse: 64 91 77   Resp: 20 18 20    Temp: 99 F (37.2 C) 98.5 F (36.9 C) 98.2 F (36.8 C)   TempSrc: Oral Oral Oral   SpO2: 100% 99% 98%   Weight:      Height:       General: alert, cooperative and no distress Lochia: appropriate Uterine Fundus: firm, U/2, lochia rubra scant Incision: Dressing is clean, dry, and intact DVT Evaluation: No evidence of DVT seen on physical exam. Calf/Ankle edema is  present  Labs: Lab Results  Component Value Date   WBC 5.2 04/12/2017   HGB 8.0 (L) 04/13/2017   HCT 23.9 (L) 04/13/2017   MCV 81.7 04/12/2017   PLT 188 04/12/2017   CMP Latest Ref Rng & Units 04/11/2017  Glucose 65 - 99 mg/dL 132(G111(H)  BUN 6 - 20 mg/dL 11  Creatinine 4.010.44 - 0.271.00 mg/dL 2.530.59  Sodium 664135 - 403145 mmol/L 134(L)  Potassium 3.5 - 5.1 mmol/L 3.7  Chloride 101 - 111 mmol/L 102  CO2 22 - 32 mmol/L 23  Calcium 8.9 - 10.3 mg/dL 8.3(L)  Total Protein 6.5 - 8.1 g/dL 5.3(L)  Total Bilirubin 0.3 - 1.2 mg/dL 0.5  Alkaline Phos 38 - 126 U/L 113  AST 15 - 41 U/L 23  ALT 14 - 54 U/L 10(L)    Discharge instruction: per After Visit Summary.  Medications:  Allergies as of 04/13/2017   No Known Allergies     Medication List    STOP taking these medications   acetaminophen 325 MG tablet Commonly known as:  TYLENOL     TAKE these medications   ferrous sulfate 325 (65 FE) MG tablet Commonly known as:  FERROUSUL Take 1 tablet (325 mg total) by mouth 2 (two) times daily.   ibuprofen 600 MG tablet Commonly known as:  ADVIL,MOTRIN Take 1 tablet (600 mg total) by mouth every 6 (six) hours.   multivitamin-prenatal 27-0.8 MG Tabs tablet Take 1 tablet by mouth daily at 12 noon.   oxyCODONE-acetaminophen 5-325 MG tablet Commonly known as:  PERCOCET/ROXICET Take 1-2 tablets by mouth every 6 (six) hours as needed.            Discharge Care Instructions  (From admission, onward)        Start     Ordered   04/13/17 0000  Discharge wound care:    Comments:  You may apply a light dressing for minor discharge from the incision or to keep waistbands of clothing from rubbing.  You may also have been discharge with a clear dressing in which case this will be removed at your postoperative clinic visit.  You may shower, use soap on your incision.  Avoiding baths or soaking the incision in the first 6 weeks following your surgery..   04/13/17 1401      Diet: routine  diet  Activity: Advance as tolerated. Pelvic rest for 6 weeks.   Outpatient follow up: Follow-up Information    Schuman, Jaquelyn Bitterhristanna R, MD. Schedule an appointment as soon as possible for a visit in 1 week(s).   Specialty:  Obstetrics and Gynecology Why:  Please call to make your 1 week postpartum follow up appointment for an incision check Contact information: [address] [place] [address] [phone]             Postpartum contraception: Undecided Rhogam Given postpartum: no Rubella vaccine given postpartum: no Varicella vaccine given postpartum: no TDaP given antepartum or postpartum: Yes  Newborn Data: Live born female  Birth Weight: 7 lb 14.6 oz (3590 g) APGAR: 4, 6  Newborn Delivery   Birth date/time:  04/09/2017 19:52:00 Delivery type:  C-Section, Low Transverse C-section categorization:  Primary      Baby Feeding: Bottle and Breast  Disposition: home with mother  SIGNED:  Oswaldo ConroyJacelyn Y Adri Schloss, CNM 04/16/2017 8:08 AM

## 2017-04-09 NOTE — Anesthesia Procedure Notes (Signed)
Procedure Name: Intubation Performed by: Lendon Colonel, CRNA Pre-anesthesia Checklist: Patient identified, Patient being monitored, Timeout performed, Emergency Drugs available and Suction available Patient Re-evaluated:Patient Re-evaluated prior to induction Oxygen Delivery Method: Circle system utilized Preoxygenation: Pre-oxygenation with 100% oxygen Induction Type: IV induction and Cricoid Pressure applied Laryngoscope Size: Mac, Sabra Heck and 2 Grade View: Grade II Tube type: Oral Tube size: 7.0 mm Number of attempts: 1 Airway Equipment and Method: Stylet Placement Confirmation: ETT inserted through vocal cords under direct vision,  positive ETCO2 and breath sounds checked- equal and bilateral Secured at: 20 cm Tube secured with: Tape Dental Injury: Teeth and Oropharynx as per pre-operative assessment

## 2017-04-09 NOTE — Progress Notes (Signed)
Harless NakayamaBlanca Y Alfaro Sondra ComeCruz is a 26 y.o. G1P0 at 6865w1d by ultrasound admitted for induction of labor due to Hypertension.  Subjective: Patient feeling well. Pain is controlled.   Objective: BP 124/72   Pulse 87   Temp 98.3 F (36.8 C) (Oral)   Resp 18   Ht 5\' 3"  (1.6 m)   Wt 213 lb (96.6 kg)   SpO2 98%   BMI 37.73 kg/m  I/O last 3 completed shifts: In: 1112.5 [P.O.:250; I.V.:812.5; IV Piggyback:50] Out: -  Total I/O In: 4689.2 [P.O.:2030; I.V.:2456; Other:103.2; IV Piggyback:100] Out: 2500 [Urine:2500]  FHT:  FHR: 145 bpm, variability: moderate,  accelerations:  Abscent,  decelerations:  Present 3-4 late decelerations.  UC:   regular, every 2-4 minutes SVE:   Dilation: 5 Effacement (%): 90 Station: -1 Exam by:: Dr. Jerene PitchSchuman  Labs: Lab Results  Component Value Date   WBC 8.9 04/07/2017   HGB 11.3 (L) 04/07/2017   HCT 33.3 (L) 04/07/2017   MCV 82.1 04/07/2017   PLT 234 04/07/2017    Assessment / Plan: Induction of labor due to gestational hypertension,  progressing well on pitocin. Patient has made some encouraging cervical change since last exam.   Labor: COntinue with pitocin. Increase dose to maximum 30miliunits Preeclampsia:  no signs or symptoms of toxicity Fetal Wellbeing:  Category II Pain Control:  Epidural I/D:  n/a Anticipated MOD:  NSVD  Tinzley Dalia R Abdoulie Tierce 04/09/2017, 4:32 PM

## 2017-04-09 NOTE — Op Note (Signed)
Cesarean Section Procedure Note Indications: chorioamnionitis, non-reassuring fetal status, prior cesarean section and term intrauterine pregnancy  Pre-operative Diagnosis: Intrauterine pregnancy 7173w1d ;  chorioamnionitis, non-reassuring fetal status, prior cesarean section and term intrauterine pregnancy gestational hypertension Post-operative Diagnosis: same, delivered. Procedure: Low Transverse Cesarean Section Surgeon: Adelene Idlerhristanna Cobie Leidner, MD Anesthesia: General endotracheal anesthesia Estimated Blood Loss:1000 Complications: None; patient tolerated the procedure well. Disposition: PACU - hemodynamically stable. Condition: stable  Findings: A female infant in the cephalic presentation. Amniotic fluid - Meconium  Birth weight 3590 g.  Apgars of 4, 6, and 7.  Intact placenta with a three-vessel cord. Grossly normal uterus, tubes and ovaries bilaterally.    Indications: Patient was induced for gestational hypertension. She had a three day induction. She made good progress with her labor and reached 9cm/100/+1 station. She had two prolonged decelerations which lasted for 3 minutes each. She recovered with position changes. The pitocin was stopped and she was given oxygen. However the baby became persistently tachycardic in the 180s and the decision to move for a cesarean section was made.   Procedure Details  The patient was taken to Operating Room, identified as the correct patient and the procedure verified as C-Section Delivery. A Time Out was held and the above information confirmed. General anesthesia was administered. . A Pfannenstiel incision was made and carried down through the subcutaneous tissue to the fascia. Fascial incision was made and extended transversely with the Mayo scissors. The fascia was separated from the underlying rectus tissue superiorly and inferiorly. The peritoneum was identified and entered using the Metzenbaum scissors.Peritoneal incision was extended  longitudinally. A low transverse hysterotomy was made. The fetus was delivered atraumatically. The umbilical cord was clamped x2 and cut and the infant was handed to the awaiting pediatricians. The placenta was removed intact and appeared normal with a 3-vessel cord. The membranes were stained with meconium.The uterus was exteriorized and cleared of all clot and debris. The hysterotomy was closed with running sutures of 0 Vicryl suture. The patient had uterine atony and was given IV pitocin, IM methergine and IM Hemabate.  A second imbricating layer was placed with the same suture. Excellent hemostasis was observed. The uterus was returned to the abdomen. The pelvis was irrigated and again, excellent hemostasis was noted. The peritoneum was repaired with 2-0 vicryl. The rectus muscles were inspected and were normal. The rectus fascia was then reapproximated with running 0-vicryl. Subcutaneous fat injected with exparel and was reapproximated with 2-0  Plain. The skin was closed with  4-0 monocryl suture in a subcuticular fashion followed by skin adhesive. Instrument, sponge, and needle counts were correct prior to the abdominal closure and at the conclusion of the case.  The patient tolerated the procedure well and was transferred to the recovery room in stable condition.   Adelene Idlerhristanna Sacred Roa, MD [facility], [facility] 04/09/2017  9:34 PM

## 2017-04-09 NOTE — Transfer of Care (Signed)
Immediate Anesthesia Transfer of Care Note  Patient: Emily Ruiz  Procedure(s) Performed: CESAREAN SECTION (N/A )  Patient Location: PACU  Anesthesia Type:General  Level of Consciousness: awake, alert  and patient cooperative  Airway & Oxygen Therapy: Patient Spontanous Breathing and Patient connected to nasal cannula oxygen  Post-op Assessment: Report given to RN and Post -op Vital signs reviewed and stable  Post vital signs: Reviewed and stable  Last Vitals:  Vitals:   04/09/17 2110 04/09/17 2115  BP: 128/81   Pulse: (!) 104 (!) 104  Resp: (!) 25 (!) 23  Temp: 36.7 C   SpO2: 100% 100%    Last Pain:  Vitals:   04/09/17 1924  TempSrc: Oral  PainSc:          Complications: No apparent anesthesia complications

## 2017-04-09 NOTE — Progress Notes (Signed)
  Labor Progress Note   26 y.o. G1P0 @ 3338w1d , admitted for  Pregnancy, Labor Management. Induction for gestational hypertension  Subjective:  Patient is comfortable with epidural  Objective:  BP 116/68   Pulse 88   Temp 98.3 F (36.8 C) (Oral)   Resp 16   Ht 5\' 3"  (1.6 m)   Wt 213 lb (96.6 kg)   SpO2 98%   BMI 37.73 kg/m  Abd: mild Extr: trace to 1+ bilateral pedal edema SVE: CERVIX: 4.5 cm dilated, 90 effaced, -2 station AROM: light meconium  EFM: FHR: 135 bpm, variability: moderate,  accelerations:  Present,  decelerations:  Absent Toco: Frequency: Every 2-4 minutes Labs: I have reviewed the patient's lab results.   Assessment & Plan:  G1P0 @ 7538w1d, admitted for  Pregnancy and Labor/Delivery Management  1. Pain management: epidural. 2. FWB: FHT category I.  3. ID: GBS positive 4. Labor management: s/p AROM, continue with pitocin, consider pitocin rest if contractions spread out  All discussed with patient, see orders  Tresea MallJane Adeyemi Hamad, CNM  [facility], [facility] 04/09/2017  6:39 AM

## 2017-04-09 NOTE — Progress Notes (Signed)
Patient ID: Emily Ruiz, female   DOB: [birth year], 26 y.o.   MRN: [id number]  Urine output has improved. Continue IVF at 150cc/hr. IUPC placed successfully.   Vitals:   04/09/17 0855 04/09/17 0908  BP: (!) 144/89   Pulse: 91   Resp: 18   Temp:  98.3 F (36.8 C)  SpO2:      Fetal heart tracing category I. No decelerations, baseline 130bpm, moderate variability, 15x15 accelerations Continue with Induction of labor. Will restart pitocin now.

## 2017-04-09 NOTE — Progress Notes (Signed)
Patient ID: Emily CraftsBlanca Y Alfaro Ruiz, female   DOB: 1991/04/28, 26 y.o.   MRN: [id number]  Patient doing well. If needed can go up to 30 milliunits of pitocin.   Adelene Idlerhristanna Schuman MD 04/09/17 2:18 PM

## 2017-04-09 NOTE — Progress Notes (Signed)
Subjective: I have assumed care from Emily Ruiz. Patient is being induced for gestational hypertension. Vitals reviewed, Bps have been controlled. Patient currently receiving 500cc fluid bolus for decreased urine output, 100cc over 6 hours. Her urine output is improving. Will check creatinine, possible AKI. Intake and output not documented well from yesterday. Strict intake and output moving forward.  Patent has been making slow progress through the first stage of labor. AROM was performed this morning and there was meconium. Fetal heart tracing is reactive, category I.  Objective Vitals:   04/09/17 0653 04/09/17 0711  BP: 120/74 131/74  Pulse: 79 85  Resp:  18  Temp:  98.3 F (36.8 C)  SpO2:     Fetal heart tracing: Category I, 130 baseline, 15x15 accelerations, no decelerations.  Reactive. Toco every 2-4 minutes.  Physical Exam  Constitutional: She appears well-developed. She appears lethargic.  Cardiovascular: Regular rhythm.  Pulmonary/Chest: Effort normal.  Abdominal: Soft. Normal appearance.  Neurological: She appears lethargic.   Assessment:  26yo G1P0 at 40weeks 1 day, IOL for gestational hypertension.  Plan:  Will stop pitocin for 2 hours for uterine rest. Encouraged mother to sleep and eat. After 2 hours will place an IUPC and continue with pitocin induction of labor. Will increase baseline fluids to 150cc/hr after fluid bolus. Will start strict I&Os.   Patient is comfortable with epidural.

## 2017-04-09 NOTE — Progress Notes (Signed)
Notified [name], CNM of low urine output, of 100cc since 0000. Verbal order to start with 500mL bolus.

## 2017-04-09 NOTE — Anesthesia Post-op Follow-up Note (Signed)
Anesthesia QCDR form completed.        

## 2017-04-09 NOTE — Progress Notes (Signed)
Emily CooperLoyda, spanish interpreter with this nurse at bedside to review plan of care for today. Pt verbalized understanding of plan of care, no questions per pt and family at this time. Pt informed that interpreter will be present during delivery to assist. Will continue to monitor.

## 2017-04-09 NOTE — Progress Notes (Signed)
Patient ID: Emily Ruiz, female   DOB: 1990-07-15, 26 y.o.   MRN: [id number]   Fetal baseline has increased to 160. Maternal fever. Will start antibiotics for chorioamnionitis and give tylenol.  Fetal heart rate tracing otherwise reassuring, accelerations 15x15, no decelerations, category II.  Continue with Induction at this time.

## 2017-04-10 ENCOUNTER — Encounter: Payer: Self-pay | Admitting: Obstetrics and Gynecology

## 2017-04-10 DIAGNOSIS — D62 Acute posthemorrhagic anemia: Secondary | ICD-10-CM | POA: Insufficient documentation

## 2017-04-10 DIAGNOSIS — Z3A39 39 weeks gestation of pregnancy: Secondary | ICD-10-CM

## 2017-04-10 DIAGNOSIS — O9081 Anemia of the puerperium: Secondary | ICD-10-CM

## 2017-04-10 LAB — CBC
HEMATOCRIT: 26.5 % — AB (ref 35.0–47.0)
HEMOGLOBIN: 8.8 g/dL — AB (ref 12.0–16.0)
MCH: 27.4 pg (ref 26.0–34.0)
MCHC: 33.3 g/dL (ref 32.0–36.0)
MCV: 82.3 fL (ref 80.0–100.0)
PLATELETS: 163 10*3/uL (ref 150–440)
RBC: 3.22 MIL/uL — AB (ref 3.80–5.20)
RDW: 16 % — ABNORMAL HIGH (ref 11.5–14.5)
WBC: 15.6 10*3/uL — AB (ref 3.6–11.0)

## 2017-04-10 MED ORDER — LACTATED RINGERS IV BOLUS (SEPSIS)
300.0000 mL | Freq: Once | INTRAVENOUS | Status: AC
  Administered 2017-04-10: 300 mL via INTRAVENOUS

## 2017-04-10 NOTE — Anesthesia Post-op Follow-up Note (Signed)
  Anesthesia Pain Follow-up Note  Patient: Emily Ruiz  Day #: 1  Date of Follow-up: 04/10/2017 Time: 8:22 AM  Last Vitals:  Vitals:   04/10/17 0402 04/10/17 0812  BP: 126/66 114/63  Pulse: 98 91  Resp: 18 20  Temp: 36.7 C 36.7 C  SpO2: 97% 98%    Level of Consciousness: alert  Pain: mild   Side Effects:None  Catheter Site Exam:clean, dry     Plan: Catheter removed/tip intact at surgeon's request  Vernie MurdersPope,  Garrus Gauthreaux G

## 2017-04-10 NOTE — Progress Notes (Signed)
POD #1 S/P LTCS for FITL. Labor c/b chorioamnionitis Subjective:   Via interpretor: Sore all over. Tired. Tolerating sips of liquids.  Has not started pumping yet. Baby in NICU.   Objective:  Blood pressure 114/63, pulse 91, temperature 98 F (36.7 C), resp. rate 20, height '5\' 3"'  (1.6 m), weight 96.6 kg (213 lb), SpO2 98 %, unknown if currently breastfeeding. UO 3250 in 24 hours, but since 0600 has been averaging about 108m/hour. Urine appears concentrated  General: Hispanic female in NAD. Does not appear toxic. Pulmonary: no increased work of breathing, CTA, but decreased breath sounds in base. Heart: RRR without murmur Abdomen: non-distended, obese, non-tender, fundus firm at level of umbilicus-1. BS hypoactive Incision: C+D+I honey comb dressing Extremities:  +2 pedal edema, SCDs on  Results for orders placed or performed during the hospital encounter of 04/07/17 (from the past 72 hour(s))  Protein / creatinine ratio, urine     Status: None   Collection Time: 04/07/17  7:57 PM  Result Value Ref Range   Creatinine, Urine 106 mg/dL   Total Protein, Urine 14 mg/dL    Comment: NO NORMAL RANGE ESTABLISHED FOR THIS TEST   Protein Creatinine Ratio 0.13 0.00 - 0.15 mg/mg[Cre]  Comprehensive metabolic panel     Status: Abnormal   Collection Time: 04/07/17  8:51 PM  Result Value Ref Range   Sodium 131 (L) 135 - 145 mmol/L   Potassium 3.9 3.5 - 5.1 mmol/L   Chloride 103 101 - 111 mmol/L   CO2 23 22 - 32 mmol/L   Glucose, Bld 94 65 - 99 mg/dL   BUN 11 6 - 20 mg/dL   Creatinine, Ser 0.60 0.44 - 1.00 mg/dL   Calcium 9.0 8.9 - 10.3 mg/dL   Total Protein 6.6 6.5 - 8.1 g/dL   Albumin 2.9 (L) 3.5 - 5.0 g/dL   AST 22 15 - 41 U/L   ALT 9 (L) 14 - 54 U/L   Alkaline Phosphatase 173 (H) 38 - 126 U/L   Total Bilirubin 0.2 (L) 0.3 - 1.2 mg/dL   GFR calc non Af Amer >60 >60 mL/min   GFR calc Af Amer >60 >60 mL/min    Comment: (NOTE) The eGFR has been calculated using the CKD EPI  equation. This calculation has not been validated in all clinical situations. eGFR's persistently <60 mL/min signify possible Chronic Kidney Disease.    Anion gap 5 5 - 15  Type and screen     Status: None   Collection Time: 04/07/17  8:51 PM  Result Value Ref Range   ABO/RH(D) O POS    Antibody Screen NEG    Sample Expiration 04/10/2017   RPR     Status: None   Collection Time: 04/07/17  8:51 PM  Result Value Ref Range   RPR Ser Ql Non Reactive Non Reactive    Comment: (NOTE) Performed At: [id number] FarmerMD PPT:[id number]  CBC     Status: Abnormal   Collection Time: 04/07/17  8:51 PM  Result Value Ref Range   WBC 8.9 3.6 - 11.0 K/uL   RBC 4.06 3.80 - 5.20 MIL/uL   Hemoglobin 11.3 (L) 12.0 - 16.0 g/dL   HCT 33.3 (L) 35.0 - 47.0 %   MCV 82.1 80.0 - 100.0 fL   MCH 27.9 26.0 - 34.0 pg   MCHC 33.9 32.0 - 36.0 g/dL   RDW 15.6 (H) 11.5 - 14.5 %   Platelets  234 150 - 440 K/uL  Comprehensive metabolic panel     Status: Abnormal   Collection Time: 04/09/17  8:37 AM  Result Value Ref Range   Sodium 132 (L) 135 - 145 mmol/L   Potassium 4.1 3.5 - 5.1 mmol/L   Chloride 102 101 - 111 mmol/L   CO2 21 (L) 22 - 32 mmol/L   Glucose, Bld 80 65 - 99 mg/dL   BUN 9 6 - 20 mg/dL   Creatinine, Ser 0.57 0.44 - 1.00 mg/dL   Calcium 9.0 8.9 - 10.3 mg/dL   Total Protein 6.2 (L) 6.5 - 8.1 g/dL   Albumin 2.5 (L) 3.5 - 5.0 g/dL   AST 20 15 - 41 U/L   ALT 9 (L) 14 - 54 U/L   Alkaline Phosphatase 187 (H) 38 - 126 U/L   Total Bilirubin 0.5 0.3 - 1.2 mg/dL   GFR calc non Af Amer >60 >60 mL/min   GFR calc Af Amer >60 >60 mL/min    Comment: (NOTE) The eGFR has been calculated using the CKD EPI equation. This calculation has not been validated in all clinical situations. eGFR's persistently <60 mL/min signify possible Chronic Kidney Disease.    Anion gap 9 5 - 15  CBC     Status: Abnormal   Collection Time: 04/09/17  8:37 AM   Result Value Ref Range   WBC 8.4 3.6 - 11.0 K/uL   RBC 4.05 3.80 - 5.20 MIL/uL   Hemoglobin 11.2 (L) 12.0 - 16.0 g/dL   HCT 34.6 (L) 35.0 - 47.0 %   MCV 85.3 80.0 - 100.0 fL   MCH 27.7 26.0 - 34.0 pg   MCHC 32.4 32.0 - 36.0 g/dL   RDW 17.1 (H) 11.5 - 14.5 %   Platelets 217 150 - 440 K/uL  CBC     Status: Abnormal   Collection Time: 04/10/17  4:56 AM  Result Value Ref Range   WBC 15.6 (H) 3.6 - 11.0 K/uL   RBC 3.22 (L) 3.80 - 5.20 MIL/uL   Hemoglobin 8.8 (L) 12.0 - 16.0 g/dL    Comment: RESULT REPEATED AND VERIFIED   HCT 26.5 (L) 35.0 - 47.0 %   MCV 82.3 80.0 - 100.0 fL   MCH 27.4 26.0 - 34.0 pg   MCHC 33.3 32.0 - 36.0 g/dL   RDW 16.0 (H) 11.5 - 14.5 %   Platelets 163 150 - 440 K/uL     Assessment/ Plan   26 y.o. G1P1001 postoperativeday # 1-stable  Chorioamnionitis-currently afebrile   Decreasing urine output  Bolus 300 ml LR, then continue LR at 62.21m/ hr and Pitocin  infusion at 62.510mhr  Clear liquid tray for breakfast, then advance as tolerated  Will consider discontinuing foley after lunch if UO normal and  tolerating fluids Pulmonary toiletry-incentive spirometer q1 hour WA Encouraged to take pain medicine to help her move and deep breathe Assist with pumping milk  Acute blood loss anemia - hemodynamically stable and asymptomatic   po iron and vitamins O POS/ RI/ VI TDAP given 01/15/2017 Flu vaccine given 02/26/2017 Breast Disposition-probably discharge [place] POD3 or [place]

## 2017-04-10 NOTE — Lactation Note (Signed)
This note was copied from a baby's chart. Lactation Consultation Note  Patient Name: Girl Mecosta BingBlanca Alfaro Cruz Today's Date: 04/10/2017    Mom was able to breastfeed well in SCN earlier today, but baby still has low blood sugars and needed some formula supplement. Mom unable to bend her arms due to past and current IV placement around antecubitals, so family is helping mom with pumping her breasts whenever she cannot go to SCN to nurse baby.    Maternal Data    Feeding Feeding Type: Bottle Fed - Formula Nipple Type: Slow - flow Length of feed: 10 min  LATCH Score                   Interventions    Lactation Tools Discussed/Used     Consult Status      Sunday CornSandra Clark Dennys Traughber 04/10/2017, 4:45 PM

## 2017-04-10 NOTE — Anesthesia Postprocedure Evaluation (Signed)
Anesthesia Post Note  Patient: Emily CraftsBlanca Y Alfaro Ruiz  Procedure(s) Performed: CESAREAN SECTION (N/A )  Patient location during evaluation: [facility] Anesthesia Type: Epidural and General Level of consciousness: awake and alert, awake and oriented Pain management: pain level controlled Vital Signs Assessment: post-procedure vital signs reviewed and stable Respiratory status: spontaneous breathing, nonlabored ventilation and respiratory function stable Cardiovascular status: blood pressure returned to baseline Postop Assessment: no headache Anesthetic complications: no Comments: Mild sore throat from intubation     Last Vitals:  Vitals:   04/10/17 0402 04/10/17 0812  BP: 126/66 114/63  Pulse: 98 91  Resp: 18 20  Temp: 36.7 C 36.7 C  SpO2: 97% 98%    Last Pain:  Vitals:   04/10/17 0621  TempSrc:   PainSc: Asleep                 Vernie MurdersPope,  Laraya Pestka G

## 2017-04-11 ENCOUNTER — Inpatient Hospital Stay: Payer: Medicaid Other

## 2017-04-11 DIAGNOSIS — O9081 Anemia of the puerperium: Secondary | ICD-10-CM

## 2017-04-11 DIAGNOSIS — Z3A39 39 weeks gestation of pregnancy: Secondary | ICD-10-CM

## 2017-04-11 DIAGNOSIS — D62 Acute posthemorrhagic anemia: Secondary | ICD-10-CM

## 2017-04-11 LAB — COMPREHENSIVE METABOLIC PANEL
ALT: 10 U/L — AB (ref 14–54)
AST: 23 U/L (ref 15–41)
Albumin: 2.1 g/dL — ABNORMAL LOW (ref 3.5–5.0)
Alkaline Phosphatase: 113 U/L (ref 38–126)
Anion gap: 9 (ref 5–15)
BILIRUBIN TOTAL: 0.5 mg/dL (ref 0.3–1.2)
BUN: 11 mg/dL (ref 6–20)
CO2: 23 mmol/L (ref 22–32)
CREATININE: 0.59 mg/dL (ref 0.44–1.00)
Calcium: 8.3 mg/dL — ABNORMAL LOW (ref 8.9–10.3)
Chloride: 102 mmol/L (ref 101–111)
GFR calc Af Amer: >60 mL/min (ref >60)
Glucose, Bld: 111 mg/dL — ABNORMAL HIGH (ref 65–99)
POTASSIUM: 3.7 mmol/L (ref 3.5–5.1)
Sodium: 134 mmol/L — ABNORMAL LOW (ref 135–145)
TOTAL PROTEIN: 5.3 g/dL — AB (ref 6.5–8.1)

## 2017-04-11 LAB — CBC
HEMATOCRIT: 19.3 % — AB (ref 35.0–47.0)
Hemoglobin: 6.5 g/dL — ABNORMAL LOW (ref 12.0–16.0)
MCH: 27.6 pg (ref 26.0–34.0)
MCHC: 33.5 g/dL (ref 32.0–36.0)
MCV: 82.5 fL (ref 80.0–100.0)
Platelets: 187 10*3/uL (ref 150–440)
RBC: 2.34 MIL/uL — ABNORMAL LOW (ref 3.80–5.20)
RDW: 16.9 % — ABNORMAL HIGH (ref 11.5–14.5)
WBC: 11.5 10*3/uL — AB (ref 3.6–11.0)

## 2017-04-11 LAB — LIPASE, BLOOD: Lipase: 13 U/L (ref 11–51)

## 2017-04-11 LAB — MAGNESIUM: MAGNESIUM: 1.5 mg/dL — AB (ref 1.7–2.4)

## 2017-04-11 MED ORDER — ONDANSETRON 4 MG PO TBDP
4.0000 mg | ORAL_TABLET | Freq: Four times a day (QID) | ORAL | Status: DC | PRN
  Administered 2017-04-11: 4 mg via ORAL
  Filled 2017-04-11: qty 1

## 2017-04-11 MED ORDER — MAGNESIUM OXIDE 400 (241.3 MG) MG PO TABS
400.0000 mg | ORAL_TABLET | ORAL | Status: AC
  Administered 2017-04-11: 400 mg via ORAL
  Filled 2017-04-11: qty 1

## 2017-04-11 NOTE — Progress Notes (Signed)
POD #2 LTCS/chorioamnionitis Subjective:  She is sitting up in a chair and eating breakfast. Spoke with patient with assistance of Spanish interpreter. She feels better this morning and is tolerating a regular diet. No difficulty with voiding or ambulating. Denies dizziness and shortness of breath. Is using incentive spirometer. Pain control is good with PRN medications. She feels like her swelling has decreased. She is having some gas pain and has not passed flatus today.  Objective:  Blood pressure 120/76, pulse 85, temperature 98.2 F (36.8 C), temperature source Oral, resp. rate 18, height '5\' 3"'  (1.6 m), weight 213 lb (96.6 kg), SpO2 97 %, currently breastfeeding.  General: NAD, well-appearing Pulmonary: no increased work of breathing, lungs CTAB Abdomen: non-distended, non-tender, fundus firm at U/1, lochia rubra scant Incision: Dressing is C/D/I Extremities: +1 edema BLE and hands, no erythema, no tenderness  Results for orders placed or performed during the hospital encounter of 04/07/17 (from the past 72 hour(s))  Comprehensive metabolic panel     Status: Abnormal   Collection Time: 04/09/17  8:37 AM  Result Value Ref Range   Sodium 132 (L) 135 - 145 mmol/L   Potassium 4.1 3.5 - 5.1 mmol/L   Chloride 102 101 - 111 mmol/L   CO2 21 (L) 22 - 32 mmol/L   Glucose, Bld 80 65 - 99 mg/dL   BUN 9 6 - 20 mg/dL   Creatinine, Ser 0.57 0.44 - 1.00 mg/dL   Calcium 9.0 8.9 - 10.3 mg/dL   Total Protein 6.2 (L) 6.5 - 8.1 g/dL   Albumin 2.5 (L) 3.5 - 5.0 g/dL   AST 20 15 - 41 U/L   ALT 9 (L) 14 - 54 U/L   Alkaline Phosphatase 187 (H) 38 - 126 U/L   Total Bilirubin 0.5 0.3 - 1.2 mg/dL   GFR calc non Af Amer >60 >60 mL/min   GFR calc Af Amer >60 >60 mL/min    Comment: (NOTE) The eGFR has been calculated using the CKD EPI equation. This calculation has not been validated in all clinical situations. eGFR's persistently <60 mL/min signify possible Chronic Kidney Disease.    Anion gap 9 5 -  15  CBC     Status: Abnormal   Collection Time: 04/09/17  8:37 AM  Result Value Ref Range   WBC 8.4 3.6 - 11.0 K/uL   RBC 4.05 3.80 - 5.20 MIL/uL   Hemoglobin 11.2 (L) 12.0 - 16.0 g/dL   HCT 34.6 (L) 35.0 - 47.0 %   MCV 85.3 80.0 - 100.0 fL   MCH 27.7 26.0 - 34.0 pg   MCHC 32.4 32.0 - 36.0 g/dL   RDW 17.1 (H) 11.5 - 14.5 %   Platelets 217 150 - 440 K/uL  CBC     Status: Abnormal   Collection Time: 04/10/17  4:56 AM  Result Value Ref Range   WBC 15.6 (H) 3.6 - 11.0 K/uL   RBC 3.22 (L) 3.80 - 5.20 MIL/uL   Hemoglobin 8.8 (L) 12.0 - 16.0 g/dL    Comment: RESULT REPEATED AND VERIFIED   HCT 26.5 (L) 35.0 - 47.0 %   MCV 82.3 80.0 - 100.0 fL   MCH 27.4 26.0 - 34.0 pg   MCHC 33.3 32.0 - 36.0 g/dL   RDW 16.0 (H) 11.5 - 14.5 %   Platelets 163 150 - 440 K/uL     Assessment:   26 y.o. G1P1001 post operative day # 2 in stable condition.   Plan:  1) Acute blood  loss anemia - hemodynamically stable and asymptomatic - PO ferrous sulfate  2) Chorioamnionitis - afebrile, no uterine tenderness, lochia without odor or clots  3) Blood Type --/--/O POS (11/18 2051) / Rubella Immune (05/04 0000) / Varicella immune / TDaP given 01/15/2017  4) Breastfeeding with some formula supplementation for hypoglycemia   5) Contraception - undecided  6) Disposition - encouraged ambulation to help with passing flatus, PRN simethicone for gas discomfort. Continue normal postpartum care.  Avel Sensor, CNM 04/11/2017  11:39 AM

## 2017-04-11 NOTE — Progress Notes (Signed)
Paged JC CNM [facility] due to pt complaint on Nausea and vomiting and abdominal pain rated at a 9.  Zofran given 1 hour ago pt expressed relieve then, but has returned.  Pt vomited shortly after having lunch. No bowel sounds heard 1500. Pt express she has not passed much gas today.  Waiting on call back.

## 2017-04-11 NOTE — Progress Notes (Signed)
Spoke with patient through the interpreter regarding her nausea and emesis with decreased bowel sounds and no flatus. Since her XRay She has passed gas on several occasions and feels much better. Tolerated her dinner without nausea/emesis. Denies feeling lightheaded and dizzy. Denies SOB and chest pain/heart palpitations. Is able to ambulate without difficulty.  Is voiding regularly without difficultly.  BP 132/67 (BP Location: Left Arm)   Pulse (!) 109   Temp 98 F (36.7 C) (Oral)   Resp 14   Ht 5\' 3"  (1.6 m)   Wt 213 lb (96.6 kg)   SpO2 96%   Breastfeeding? Unknown Comment: c section 2 days ago  BMI 37.73 kg/m   GEN: NAD  Recent Labs    04/09/17 0837 04/10/17 0456 04/11/17 1745  HGB 11.2* 8.8* 6.5*  HCT 34.6* 26.5* 19.3*   CMP WNL (magnesium 1.5)  A/P: POD#2 s/p 1LTCS with likely resolving ileus.  Her issue has likely been due to slow post-operative bleed as her blood counts have dropped significantly since yesterday morning. Continue to monitor ileus. Acute blood loss anemia: Discussed possible need for blood transfusion, but she is asymptomatic at this time.  If she develops symptoms, will go ahead and transfuse.  Otherwise, recheck in the AM and if she continues to have a drop in her blood counts, will offer her a blood tranfusion of pRBCs.   All this discussed through hospital Spanish interpreter.  All questions answered.  Thomasene MohairStephen Jaymarie Yeakel, MD 04/11/2017 7:37 PM

## 2017-04-11 NOTE — Progress Notes (Signed)
[place] [facility] [place] [facility] xray results.  Acknowledge no new orders this time.

## 2017-04-12 DIAGNOSIS — Z3A39 39 weeks gestation of pregnancy: Secondary | ICD-10-CM

## 2017-04-12 DIAGNOSIS — D62 Acute posthemorrhagic anemia: Secondary | ICD-10-CM

## 2017-04-12 DIAGNOSIS — O9081 Anemia of the puerperium: Secondary | ICD-10-CM

## 2017-04-12 LAB — CBC
HCT: 19.8 % — ABNORMAL LOW (ref 35.0–47.0)
HCT: 17.8 % — ABNORMAL LOW (ref 35.0–47.0)
HEMOGLOBIN: 6.1 g/dL — AB (ref 12.0–16.0)
Hemoglobin: 6.6 g/dL — ABNORMAL LOW (ref 12.0–16.0)
MCH: 27.9 pg (ref 26.0–34.0)
MCH: 27.9 pg (ref 26.0–34.0)
MCHC: 33.4 g/dL (ref 32.0–36.0)
MCHC: 34.1 g/dL (ref 32.0–36.0)
MCV: 83.7 fL (ref 80.0–100.0)
MCV: 81.7 fL (ref 80.0–100.0)
PLATELETS: 188 10*3/uL (ref 150–440)
Platelets: 202 10*3/uL (ref 150–440)
RBC: 2.37 MIL/uL — ABNORMAL LOW (ref 3.80–5.20)
RBC: 2.18 MIL/uL — AB (ref 3.80–5.20)
RDW: 16.7 % — ABNORMAL HIGH (ref 11.5–14.5)
RDW: 16.2 % — ABNORMAL HIGH (ref 11.5–14.5)
WBC: 7 10*3/uL (ref 3.6–11.0)
WBC: 5.2 10*3/uL (ref 3.6–11.0)

## 2017-04-12 LAB — PREPARE RBC (CROSSMATCH)

## 2017-04-12 MED ORDER — SODIUM CHLORIDE 0.9 % IV SOLN
Freq: Once | INTRAVENOUS | Status: AC
  Administered 2017-04-12: 23:00:00 via INTRAVENOUS

## 2017-04-12 NOTE — Progress Notes (Signed)
Patient ID: Emily CraftsBlanca Y Alfaro Ruiz, female   DOB: 05-26-1990, 26 y.o.   MRN: [id number]  Obstetric Postpartum/PostOperative Daily Progress Note Subjective:  26 y.o. G1P1001 POD#3 status post primary cesarean delivery.  She is ambulating, is tolerating po, is voiding spontaneously.  Her pain is well controlled on PO pain medications. Her lochia is less than menses. She had a fever overnight to 100.85F, she felt chills at the time. Afebrile since.  Denies feeling lightheaded, dizzy, short of breath, chest pain, palpitations.  Continues to have pass flatus. No BM.  No current nausea/vomiting.  Has tolerated clear liquids.    Medications SCHEDULED MEDICATIONS  . ibuprofen  600 mg Oral Q6H  . prenatal multivitamin  1 tablet Oral Q1200  . senna-docusate  2 tablet Oral Q24H  . simethicone  80 mg Oral TID PC  . simethicone  80 mg Oral Q24H    MEDICATION INFUSIONS  . lactated ringers 125 mL/hr at 04/10/17 2112    PRN MEDICATIONS  acetaminophen, bisacodyl, coconut oil, witch hazel-glycerin **AND** dibucaine, diphenhydrAMINE, menthol-cetylpyridinium, ondansetron, oxyCODONE-acetaminophen, oxyCODONE-acetaminophen, simethicone, sodium phosphate, zolpidem    Objective:   Vitals:   04/11/17 2027 04/11/17 2338 04/12/17 0432 04/12/17 0743  BP: (!) 148/88 132/74 134/80 128/71  Pulse: (!) 106 (!) 101 (!) 119 (!) 115  Resp: 20 18 16 18   Temp: (!) 100.5 F (38.1 C) 98.9 F (37.2 C) 99.2 F (37.3 C) 98.3 F (36.8 C)  TempSrc: Oral Oral Oral Oral  SpO2: 96% 96% 97% 97%  Weight:      Height:        Current Vital Signs 24h Vital Sign Ranges  T 98.3 F (36.8 C) Temp  Avg: 99 F (37.2 C)  Min: 98 F (36.7 C)  Max: 100.5 F (38.1 C)  BP 128/71 BP  Min: 128/71  Max: 148/88  HR (!) 115 Pulse  Avg: 110  Min: 101  Max: 119  RR 18 Resp  Avg: 17.2  Min: 14  Max: 20  SaO2 97 % Not Delivered SpO2  Avg: 96.4 %  Min: 96 %  Max: 97 %       24 Hour I/O Current Shift I/O  Time Ins Outs No intake/output data  recorded. No intake/output data recorded.  General: NAD Pulmonary: no increased work of breathing, CTAB Abdomen: mild-moderately distended, non-tender, fundus firm at level of umbilicus, high-pitched bowel sounds Inc: Clean/dry/intact Extremities: no edema, no erythema, no tenderness  Labs:  Recent Labs  Lab 04/10/17 0456 04/11/17 1745 04/12/17 0522  WBC 15.6* 11.5* 7.0  HGB 8.8* 6.5* 6.6*  HCT 26.5* 19.3* 19.8*  PLT 163 187 202     Assessment:   26 y.o. G1P1001 postoperative day # 3, s/p primary cesarean section  Plan:  1) Acute blood loss anemia - hemodynamically stable and asymptomatic - po ferrous sulfate for now. - repeat CBC this PM. If drops, will transfuse - if stable, will discharge tomorrow with very strict precautions.  2) O POS / Rubella Immune (05/04 0000)/ Varicella Immune  3) TDAP status received 01/15/17, flu vaccine status: received 02/26/17  4) breast and formula /Contraception = undecided   5) if has another fever, will initiate workup with CXR, blood cultures, urine culture.    6) Disposition: home tomorrow, if stable today  Conard NovakStephen D. Natahlia Hoggard, MD, [facility] 04/12/2017 9:32 AM

## 2017-04-12 NOTE — Plan of Care (Signed)
Pt. Stable and within normal limits. Pt. Has generalized edema in the extremities, but does not complain of headaches, blurred vision, or epigastric pain. Reflexes and clonus are normal. Will be receiving 2 units of blood for low hemoglobin.

## 2017-04-12 NOTE — Progress Notes (Signed)
Patient ID: Emily CraftsBlanca Y Alfaro Cruz, female   DOB: 23-Feb-1991, 26 y.o.   MRN: [id number]  The patient's blood counts have continued to drop. She remains minimally symptomatic. However, given the continued fall in her blood counts and her very low current level of hgb and hct, I recommend a transfusion of 2 units pRBCs.   Patient consented by me personally with the interpreter present to translate.  She agrees to the transfusion. Recheck CBC in AM.   Thomasene MohairStephen Onesha Krebbs, MD 04/12/2017 8:27 PM

## 2017-04-13 LAB — HEMOGLOBIN AND HEMATOCRIT, BLOOD
HEMATOCRIT: 23.9 % — AB (ref 35.0–47.0)
HEMOGLOBIN: 8 g/dL — AB (ref 12.0–16.0)

## 2017-04-13 LAB — SURGICAL PATHOLOGY

## 2017-04-13 MED ORDER — OXYCODONE-ACETAMINOPHEN 5-325 MG PO TABS: 1.0000 | ORAL_TABLET | Freq: Four times a day (QID) | ORAL | 0 refills | Status: DC | PRN

## 2017-04-13 MED ORDER — FERROUS SULFATE 325 (65 FE) MG PO TABS: 325.0000 mg | ORAL_TABLET | Freq: Two times a day (BID) | ORAL | 1 refills | Status: DC

## 2017-04-13 MED ORDER — IBUPROFEN 600 MG PO TABS: 600.0000 mg | ORAL_TABLET | Freq: Four times a day (QID) | ORAL | 0 refills | Status: DC

## 2017-04-13 NOTE — Progress Notes (Addendum)
POD#4 pLTCS Subjective:  Spoke with patient with Spanish interpreter assistance. She is alert and sitting up in bed. She denies dizziness, shortness of breath, and difficulty ambulating. She is tolerating a regular diet, voiding without difficulty, and passing flatus. She is feeling less fatigued after receiving a blood transfusion (2 units PRBC). Pain control is good with PO medications. Her only complaint is some phlegm when she coughs that is faintly blood-tinged and some throat irritation since surgery.   Objective:  Blood pressure (!) 142/83, pulse 64, temperature 99 F (37.2 C), temperature source Oral, resp. rate 20, height _0  (1.6 m), weight 213 lb (96.6 kg), SpO2 100 %, unknown if currently breastfeeding.  General: NAD Pulmonary: no increased work of breathing Abdomen: non-distended, non-tender, fundus firm, lochia scant Incision: dressing C/D/I Extremities: non-pitting edema, no erythema, no tenderness  Results for orders placed or performed during the hospital encounter of 04/07/17 (from the past 72 hour(s))  CBC     Status: Abnormal   Collection Time: 04/11/17  5:45 PM  Result Value Ref Range   WBC 11.5 (H) 3.6 - 11.0 K/uL   RBC 2.34 (L) 3.80 - 5.20 MIL/uL   Hemoglobin 6.5 (L) 12.0 - 16.0 g/dL   HCT 19.3 (L) 35.0 - 47.0 %   MCV 82.5 80.0 - 100.0 fL   MCH 27.6 26.0 - 34.0 pg   MCHC 33.5 32.0 - 36.0 g/dL   RDW 16.9 (H) 11.5 - 14.5 %   Platelets 187 150 - 440 K/uL  Comprehensive metabolic panel     Status: Abnormal   Collection Time: 04/11/17  5:45 PM  Result Value Ref Range   Sodium 134 (L) 135 - 145 mmol/L   Potassium 3.7 3.5 - 5.1 mmol/L   Chloride 102 101 - 111 mmol/L   CO2 23 22 - 32 mmol/L   Glucose, Bld 111 (H) 65 - 99 mg/dL   BUN 11 6 - 20 mg/dL   Creatinine, Ser 0.59 0.44 - 1.00 mg/dL   Calcium 8.3 (L) 8.9 - 10.3 mg/dL   Total Protein 5.3 (L) 6.5 - 8.1 g/dL   Albumin 2.1 (L) 3.5 - 5.0 g/dL   AST 23 15 - 41 U/L   ALT 10 (L) 14 - 54 U/L   Alkaline  Phosphatase 113 38 - 126 U/L   Total Bilirubin 0.5 0.3 - 1.2 mg/dL   GFR calc non Af Amer >60 >60 mL/min   GFR calc Af Amer >60 >60 mL/min    Comment: (NOTE) The eGFR has been calculated using the CKD EPI equation. This calculation has not been validated in all clinical situations. eGFR's persistently <60 mL/min signify possible Chronic Kidney Disease.    Anion gap 9 5 - 15  Magnesium     Status: Abnormal   Collection Time: 04/11/17  5:45 PM  Result Value Ref Range   Magnesium 1.5 (L) 1.7 - 2.4 mg/dL  Lipase, blood     Status: None   Collection Time: 04/11/17  5:45 PM  Result Value Ref Range   Lipase 13 11 - 51 U/L  CBC     Status: Abnormal   Collection Time: 04/12/17  5:22 AM  Result Value Ref Range   WBC 7.0 3.6 - 11.0 K/uL   RBC 2.37 (L) 3.80 - 5.20 MIL/uL   Hemoglobin 6.6 (L) 12.0 - 16.0 g/dL   HCT 19.8 (L) 35.0 - 47.0 %   MCV 83.7 80.0 - 100.0 fL   MCH 27.9 26.0 - 34.0 pg  MCHC 33.4 32.0 - 36.0 g/dL   RDW 16.7 (H) 11.5 - 14.5 %   Platelets 202 150 - 440 K/uL  CBC     Status: Abnormal   Collection Time: 04/12/17  5:58 PM  Result Value Ref Range   WBC 5.2 3.6 - 11.0 K/uL   RBC 2.18 (L) 3.80 - 5.20 MIL/uL   Hemoglobin 6.1 (L) 12.0 - 16.0 g/dL   HCT 17.8 (L) 35.0 - 47.0 %   MCV 81.7 80.0 - 100.0 fL   MCH 27.9 26.0 - 34.0 pg   MCHC 34.1 32.0 - 36.0 g/dL   RDW 16.2 (H) 11.5 - 14.5 %   Platelets 188 150 - 440 K/uL  Type and screen [facility]     Status: None (Preliminary result)   Collection Time: 04/12/17  8:41 PM  Result Value Ref Range   ABO/RH(D) O POS    Antibody Screen NEG    Sample Expiration 04/15/2017    Unit Number [id number]    Blood Component Type RED CELLS,LR    Unit division 00    Status of Unit ISSUED    Transfusion Status OK TO TRANSFUSE    Crossmatch Result Compatible    Unit Number [id number]    Blood Component Type RED CELLS,LR    Unit division 00    Status of Unit ISSUED,FINAL    Transfusion Status OK TO  TRANSFUSE    Crossmatch Result Compatible   Prepare RBC     Status: None   Collection Time: 04/12/17  8:41 PM  Result Value Ref Range   Order Confirmation ORDER PROCESSED BY BLOOD BANK      Assessment:   26 y.o. G1P1001 postoperative day # 4 following primary LTCS, in stable condition.   Plan:  1) Acute blood loss anemia - s/p 2 units PRBC, awaiting results of post-transfusion H&H.  2) Blood Type --/--/O POS (11/23 2041) / Rubella Immune (05/04 0000) / Varicella Immune  3) TDAP status - received antepartum, Flu received 02/26/17.  4) Breast and formula feeding  5) Contraception - undecided  6) Disposition: Discharge home today if blood counts have risen and she remains asymptomatic.  Avel Sensor, CNM 04/13/2017  12:10 PM

## 2017-04-13 NOTE — Progress Notes (Signed)
Discharge order received from doctor. CNM notified of elevated BP this afternoon prior to discharge. OK given by CNM to discharge patient. Reviewed discharge instructions and prescriptions using an interpreter with patient and answered all questions. Incision cleaning kit given.  Follow up appointment instructions given. Patient verbalized understanding. ID bands checked. Patient discharged home with infant via wheelchair by nursing/auxillary.     Garner, RN  

## 2017-04-14 LAB — BPAM RBC
BLOOD PRODUCT EXPIRATION DATE: 201812122359
Blood Product Expiration Date: 201812122359
ISSUE DATE / TIME: 201811232237
ISSUE DATE / TIME: 201811240212
UNIT TYPE AND RH: 5100
Unit Type and Rh: 5100

## 2017-04-14 LAB — TYPE AND SCREEN
ABO/RH(D): O POS
ANTIBODY SCREEN: NEGATIVE
UNIT DIVISION: 0
Unit division: 0

## 2017-04-16 ENCOUNTER — Encounter: Payer: Self-pay | Admitting: Obstetrics and Gynecology

## 2017-04-16 ENCOUNTER — Ambulatory Visit (INDEPENDENT_AMBULATORY_CARE_PROVIDER_SITE_OTHER): Payer: Self-pay | Admitting: Obstetrics and Gynecology

## 2017-04-16 DIAGNOSIS — D62 Acute posthemorrhagic anemia: Secondary | ICD-10-CM

## 2017-04-16 DIAGNOSIS — O163 Unspecified maternal hypertension, third trimester: Secondary | ICD-10-CM

## 2017-04-17 ENCOUNTER — Encounter: Payer: Self-pay | Admitting: Obstetrics and Gynecology

## 2017-04-17 NOTE — Progress Notes (Signed)
Postpartum Visit  Chief Complaint:  Chief Complaint  Patient presents with  . Follow-up    incision check    History of Present Illness: Patient is a 26 y.o. G1P1001 presents for postpartum visit. She is doing well. She is controlling her pain with motrin. The narcotic pain medicine made her tired. She denies redness, swelling or drainage from the incision. Her mood is good. Her infant is at home with her.   Review the Delivery Report for details.  Date of delivery: 04/09/2016 This patient has no babies on file. Cesarean Section: chorioamnionitis, fetal intolerance of labor.  Pregnancy or labor problems:  yes Any problems since the delivery:  Patient received a postpartum transfusion. She has felt well since leaving the hospital.   Newborn Details:  SINGLETON :  Breast Feeding:  yes Post partum depression/anxiety noted:  no [place] Post-Part[place] Depression Score:  not applicable   Past Medical History:  Past Medical History:  Diagnosis Date  . Medical history non-contributory     Past Surgical History:  Past Surgical History:  Procedure Laterality Date  . CESAREAN SECTION N/A 04/09/2017   Procedure: CESAREAN SECTION;  Surgeon: Natale MilchSchuman, Christanna R, MD;  Location: [facility] ORS;  Service: Obstetrics;  Laterality: N/A;  . NO PAST SURGERIES      Family History:  No family history on file.  Social History:  Social History   Socioeconomic History  . Marital status: Single    Spouse name: Not on file  . Number of children: Not on file  . Years of education: Not on file  . Highest education level: Not on file  Social Needs  . Financial resource strain: Not on file  . Food insecurity - worry: Not on file  . Food insecurity - inability: Not on file  . Transportation needs - medical: Not on file  . Transportation needs - non-medical: Not on file  Occupational History  . Not on file  Tobacco Use  . Smoking status: Never Smoker  . Smokeless tobacco: Never Used    Substance and Sexual Activity  . Alcohol use: No  . Drug use: No  . Sexual activity: Not on file  Other Topics Concern  . Not on file  Social History Narrative  . Not on file    Allergies:  No Known Allergies  Medications: Prior to Admission medications   Medication Sig Start Date End Date Taking? Authorizing Provider  ferrous sulfate (FERROUSUL) 325 (65 FE) MG tablet Take 1 tablet (325 mg total) by mouth 2 (two) times daily. 04/13/17  Yes Oswaldo ConroySchmid, Jacelyn Y, CNM  ibuprofen (ADVIL,MOTRIN) 600 MG tablet Take 1 tablet (600 mg total) by mouth every 6 (six) hours. 04/13/17  Yes Oswaldo ConroySchmid, Jacelyn Y, CNM  oxyCODONE-acetaminophen (PERCOCET/ROXICET) 5-325 MG tablet Take 1-2 tablets by mouth every 6 (six) hours as needed. 04/13/17  Yes Oswaldo ConroySchmid, Jacelyn Y, CNM  Prenatal Vit-Fe Fumarate-FA (MULTIVITAMIN-PRENATAL) 27-0.8 MG TABS tablet Take 1 tablet by mouth daily at 12 noon.   Yes [provider]    Physical Exam Vitals:  Vitals:   04/16/17 1503  BP: 122/78  Pulse: 68    General: NAD HEENT: normocephalic, anicteric Pulmonary: No increased work of breathing Abdomen: NABS, soft, non-tender, non-distended.  Umbilicus without lesions.  No hepatomegaly, splenomegaly  Bandage removed from incision. Cry, no erythema, no drainage. Healing well.   Assessment: 26 y.o. G1P1001 presenting for 1 week postpartum visit  Plan: Problem List Items Addressed This Visit    None  Discussed care of incision by showering daily and keeping incision dry.  Return to care in 1 week.

## 2017-04-25 ENCOUNTER — Encounter: Payer: Self-pay | Admitting: Obstetrics and Gynecology

## 2017-04-25 ENCOUNTER — Ambulatory Visit (INDEPENDENT_AMBULATORY_CARE_PROVIDER_SITE_OTHER): Payer: Self-pay | Admitting: Obstetrics and Gynecology

## 2017-04-27 NOTE — Progress Notes (Signed)
  OBSTETRICS POSTPARTUM CLINIC PROGRESS NOTE  Subjective:     Emily Ruiz is a 26 y.o. 561P1001 female who presents for a postpartum visit. She is 2 weeks postpartum following a Pregnancy complicated by: chorioamnionitis and delivery by C-section emergent.  I have fully reviewed the prenatal and intrapartum course. Anesthesia: epidural and general.  Postpartum course has been complicated by complicated by anemia.  Baby is feeding by Breast.  Bleeding: patient has not  resumed menses.  Bowel function is normal. Bladder function is normal.  Patient is not sexually active.  Patient denies symptoms of depression.  The following portions of the patient's history were reviewed and updated as appropriate: allergies, current medications, past family history, past medical history, past social history, past surgical history and problem list.  Review of Systems Pertinent items are noted in HPI. Pertinent items noted in HPI and remainder of comprehensive ROS otherwise negative.  Objective:    BP 112/72   Pulse 71   Wt 180 lb (81.6 kg)   BMI 31.89 kg/m   General:  alert and no distress   Breasts:  inspection negative, no nipple discharge or bleeding, no masses or nodularity palpable  Lungs: clear to auscultation bilaterally  Heart:  regular rate and rhythm, S1, S2 normal, no murmur, click, rub or gallop  Abdomen: soft, non-tender; bowel sounds normal; no masses,  no organomegaly.  Well healed Pfannenstiel incision                            Assessment:  Post Partum Care visit There are no diagnoses linked to this encounter.  Plan:  Resume all normal activities Follow up in: 4 week at the health department or as needed.   Adelene Idlerhristanna Schuman, MD [facility], [facility] 04/27/2017  7:57 PM

## 2017-05-09 ENCOUNTER — Encounter: Payer: Self-pay | Admitting: Obstetrics and Gynecology

## 2017-07-11 ENCOUNTER — Telehealth: Payer: Self-pay | Admitting: Obstetrics and Gynecology

## 2017-07-11 NOTE — Telephone Encounter (Signed)
Pt is calling about her bill due to Medicaid. Was sent bill that was to be covered by Medicaid. Please advise. Pt does need interpreter.

## 2019-01-18 ENCOUNTER — Other Ambulatory Visit: Payer: Self-pay

## 2019-01-18 ENCOUNTER — Encounter: Payer: Self-pay | Admitting: Emergency Medicine

## 2019-01-18 DIAGNOSIS — K2900 Acute gastritis without bleeding: Secondary | ICD-10-CM | POA: Insufficient documentation

## 2019-01-18 LAB — COMPREHENSIVE METABOLIC PANEL
ALT: 26 U/L (ref 0–44)
AST: 17 U/L (ref 15–41)
Albumin: 4.7 g/dL (ref 3.5–5.0)
Alkaline Phosphatase: 75 U/L (ref 38–126)
Anion gap: 7 (ref 5–15)
BUN: 11 mg/dL (ref 6–20)
CO2: 29 mmol/L (ref 22–32)
Calcium: 10 mg/dL (ref 8.9–10.3)
Chloride: 103 mmol/L (ref 98–111)
Creatinine, Ser: 0.6 mg/dL (ref 0.44–1.00)
GFR calc Af Amer: >60 mL/min (ref >60)
GFR calc non Af Amer: >60 mL/min (ref >60)
Glucose, Bld: 116 mg/dL — ABNORMAL HIGH (ref 70–99)
Potassium: 4.1 mmol/L (ref 3.5–5.1)
Sodium: 139 mmol/L (ref 135–145)
Total Bilirubin: 0.1 mg/dL — ABNORMAL LOW (ref 0.3–1.2)
Total Protein: 8.2 g/dL — ABNORMAL HIGH (ref 6.5–8.1)

## 2019-01-18 LAB — URINALYSIS, COMPLETE (UACMP) WITH MICROSCOPIC
Bacteria, UA: NONE SEEN
Bilirubin Urine: NEGATIVE
Glucose, UA: NEGATIVE mg/dL
Hgb urine dipstick: NEGATIVE
Ketones, ur: NEGATIVE mg/dL
Leukocytes,Ua: NEGATIVE
Nitrite: NEGATIVE
Protein, ur: NEGATIVE mg/dL
Specific Gravity, Urine: 1.005 (ref 1.005–1.030)
pH: 7 (ref 5.0–8.0)

## 2019-01-18 LAB — LIPASE, BLOOD: Lipase: 25 U/L (ref 11–51)

## 2019-01-18 LAB — CBC
HCT: 39 % (ref 36.0–46.0)
Hemoglobin: 12.8 g/dL (ref 12.0–15.0)
MCH: 27 pg (ref 26.0–34.0)
MCHC: 32.8 g/dL (ref 30.0–36.0)
MCV: 82.3 fL (ref 80.0–100.0)
Platelets: 316 10*3/uL (ref 150–400)
RBC: 4.74 MIL/uL (ref 3.87–5.11)
RDW: 13.1 % (ref 11.5–15.5)
WBC: 7.9 10*3/uL (ref 4.0–10.5)
nRBC: 0 % (ref 0.0–0.2)

## 2019-01-18 LAB — POCT PREGNANCY, URINE: Preg Test, Ur: NEGATIVE

## 2019-01-18 NOTE — ED Triage Notes (Signed)
[employer] interpreter Ivin Booty [id number] reports epigastric pain x 6 weeks; was put on Omeprazole by her doctor the beginning of August but pain continues; pain is worse and abd feels distended after eating, especially fatty or greasy foods; c/o burning in her throat; denies N/V/D; pt in no acute distress; talking in complete coherent sentences;

## 2019-01-19 ENCOUNTER — Emergency Department
Admission: EM | Admit: 2019-01-19 | Discharge: 2019-01-19 | Disposition: A | Discharge status: Home / Self Care | Payer: Self-pay | Attending: Emergency Medicine | Admitting: Emergency Medicine

## 2019-01-19 ENCOUNTER — Emergency Department: Payer: Self-pay

## 2019-01-19 DIAGNOSIS — K2900 Acute gastritis without bleeding: Secondary | ICD-10-CM

## 2019-01-19 DIAGNOSIS — R1013 Epigastric pain: Secondary | ICD-10-CM

## 2019-01-19 HISTORY — DX: Gastro-esophageal reflux disease without esophagitis: K21.9

## 2019-01-19 HISTORY — DX: Gastritis, unspecified, without bleeding: K29.70

## 2019-01-19 MED ORDER — FAMOTIDINE IN NACL 20-0.9 MG/50ML-% IV SOLN
20.0000 mg | Freq: Once | INTRAVENOUS | Status: AC
  Administered 2019-01-19: 03:00:00 20 mg via INTRAVENOUS
  Filled 2019-01-19: qty 50

## 2019-01-19 MED ORDER — SODIUM CHLORIDE 0.9 % IV BOLUS
1000.0000 mL | Freq: Once | INTRAVENOUS | Status: AC
  Administered 2019-01-19: 1000 mL via INTRAVENOUS

## 2019-01-19 MED ORDER — FAMOTIDINE 20 MG PO TABS: 20.0000 mg | ORAL_TABLET | Freq: Two times a day (BID) | ORAL | 0 refills | Status: DC

## 2019-01-19 MED ORDER — SUCRALFATE 1 GM/10ML PO SUSP: 1.0000 g | Freq: Four times a day (QID) | ORAL | 1 refills | Status: DC

## 2019-01-19 NOTE — ED Provider Notes (Signed)
[facility] Emergency Department Provider Note   ____________________________________________   First MD Initiated Contact with Patient 01/19/19 [phone]     (approximate)  I have reviewed the triage vital signs and the nursing notes.   HISTORY  Chief Complaint Abdominal Pain  History obtained via [employer] Spanish interpreter  HPI Harless NakayamaBlanca Y Alfaro Sondra ComeCruz is a 28 y.o. female who presents to the ED from home with a chief complaint of epigastric pain.  Patient reports a 6-week history of epigastric pain, placed on omeprazole by her PCP.  No imaging performed.  Complains of persistent sharp and burning pain which is worse after eating.  Complains of burning in her throat.  Denies fever, cough, chest pain, shortness of breath, nausea, vomiting, diarrhea.  Denies recent travel or trauma.        Past Medical History:  Diagnosis Date  . Gastritis   . GERD (gastroesophageal reflux disease)   . Medical history non-contributory     Patient Active Problem List   Diagnosis Date Noted  . Acute blood loss as cause of postoperative anemia 04/11/2017  . Postoperative anemia due to acute blood loss 04/10/2017  . Chorioamnionitis 04/09/2017  . Non-reassuring fetal heart tones complicating pregnancy, antepartum 04/09/2017  . Cesarean delivery delivered 04/09/2017  . Labor and delivery indication for care or intervention 04/07/2017  . Post term pregnancy 04/07/2017  . Elevated blood pressure affecting pregnancy in third trimester, antepartum 04/07/2017    Past Surgical History:  Procedure Laterality Date  . CESAREAN SECTION N/A 04/09/2017   Procedure: CESAREAN SECTION;  Surgeon: Natale MilchSchuman, Christanna R, MD;  Location: [facility] ORS;  Service: Obstetrics;  Laterality: N/A;  . NO PAST SURGERIES      Prior to Admission medications   Medication Sig Start Date End Date Taking? Authorizing Provider  loratadine (CLARITIN) 10 MG tablet Take 10 mg by mouth daily.   Yes [provider]  omeprazole (PRILOSEC) 20 MG capsule Take 20 mg by mouth daily.   Yes [provider]    Allergies Patient has no known allergies.  History reviewed. No pertinent family history.  Social History Social History   Tobacco Use  . Smoking status: Never Smoker  . Smokeless tobacco: Never Used  Substance Use Topics  . Alcohol use: No  . Drug use: No    Review of Systems  Constitutional: No fever/chills Eyes: No visual changes. ENT: No sore throat. Cardiovascular: Denies chest pain. Respiratory: Denies shortness of breath. Gastrointestinal: Positive for abdominal pain.  No nausea, no vomiting.  No diarrhea.  No constipation. Genitourinary: Negative for dysuria. Musculoskeletal: Negative for back pain. Skin: Negative for rash. Neurological: Negative for headaches, focal weakness or numbness.   ____________________________________________   PHYSICAL EXAM:  VITAL SIGNS: ED Triage Vitals  Enc Vitals Group     BP 01/18/19 2129 140/69     Pulse Rate 01/18/19 2129 99     Resp 01/18/19 2129 17     Temp 01/18/19 2129 100.1 F (37.8 C)     Temp Source 01/18/19 2129 Oral     SpO2 01/18/19 2129 100 %     Weight 01/18/19 2113 200 lb (90.7 kg)     Height --      Head Circumference --      Peak Flow --      Pain Score 01/18/19 2112 8     Pain Loc --      Pain Edu? --      Excl. in GC? --  Constitutional: Alert and oriented. Well appearing and in mild acute distress. Eyes: Conjunctivae are normal. PERRL. EOMI. Head: Atraumatic. Nose: No congestion/rhinnorhea. Mouth/Throat: Mucous membranes are moist.  Oropharynx non-erythematous. Neck: No stridor.   Cardiovascular: Normal rate, regular rhythm. Grossly normal heart sounds.  Good peripheral circulation. Respiratory: Normal respiratory effort.  No retractions. Lungs CTAB. Gastrointestinal: Soft and mildly tender to palpation epigastrium and right upper quadrant without rebound or guarding. No  distention. No abdominal bruits. No CVA tenderness. Musculoskeletal: No lower extremity tenderness nor edema.  No joint effusions. Neurologic:  Normal speech and language. No gross focal neurologic deficits are appreciated. No gait instability. Skin:  Skin is warm, dry and intact. No rash noted. Psychiatric: Mood and affect are normal. Speech and behavior are normal.  ____________________________________________   LABS (all labs ordered are listed, but only abnormal results are displayed)  Labs Reviewed  COMPREHENSIVE METABOLIC PANEL - Abnormal; Notable for the following components:      Result Value   Glucose, Bld 116 (*)    Total Protein 8.2 (*)    Total Bilirubin 0.1 (*)    All other components within normal limits  URINALYSIS, COMPLETE (UACMP) WITH MICROSCOPIC - Abnormal; Notable for the following components:   Color, Urine COLORLESS (*)    APPearance CLEAR (*)    All other components within normal limits  LIPASE, BLOOD  CBC  POC URINE PREG, ED  POCT PREGNANCY, URINE   ____________________________________________  EKG  None ____________________________________________  RADIOLOGY  ED MD interpretation: Unremarkable ultrasound  Official radiology report(s): US Abdomen Limited Ruq  Result Date: 01/19/2019 CLINICAL DATA:  Epigastric pain for 6 weeks. EXAM: ULTRASOUND ABDOMEN LIMITED RIGHT UPPER QUADRANT COMPARISON:  Two-view abdomen 04/11/2017 FINDINGS: Gallbladder: No gallstones or wall thickening visualized. No sonographic Murphy sign noted by sonographer. Maximal wall thickness is 1.9 mm, within normal limits Common bile duct: Diameter: 3.1 mm, within normal limits Liver: Liver is mildly echogenic. No discrete lesions are present. There is no intrahepatic biliary dilation. Portal vein is patent on color Doppler imaging with normal direction of blood flow towards the liver. Other: None. IMPRESSION: 1. Acute abnormality. 2. Normal appearance of the gallbladder and common  bile duct. 3. Hepatic steatosis. Electronically Signed   By: San Morelle M.D.   On: 01/19/2019 04:13    ____________________________________________   PROCEDURES  Procedure(s) performed (including Critical Care):  Procedures   ____________________________________________   INITIAL IMPRESSION / ASSESSMENT AND PLAN / ED COURSE  As part of my medical decision making, I reviewed the following data within the [place] notes reviewed and incorporated, Labs reviewed, Old chart reviewed, Radiograph reviewed and Notes from prior ED visits     Emnet Monk was evaluated in Emergency Department on 01/19/2019 for the symptoms described in the history of present illness. She was evaluated in the context of the global COVID-19 pandemic, which necessitated consideration that the patient might be at risk for infection with the SARS-CoV-2 virus that causes COVID-19. Institutional protocols and algorithms that pertain to the evaluation of patients at risk for COVID-19 are in a state of rapid change based on information released by regulatory bodies including the CDC and federal and state organizations. These policies and algorithms were followed during the patient's care in the ED.   28 year old female who presents with a 6-week history of epigastric pain. Differential diagnosis includes, but is not limited to, biliary disease (biliary colic, acute cholecystitis, cholangitis, choledocholithiasis, etc), intrathoracic causes for epigastric abdominal pain  including ACS, gastritis, duodenitis, pancreatitis, small bowel or large bowel obstruction, abdominal aortic aneurysm, hernia, and ulcer(s).  Lab work unremarkable.  Will obtain right upper quadrant abdominal ultrasound.  Administer IV Pepcid for discomfort.   Clinical Course as of Jan 18 542  Mon Jan 19, 2019  5329 Patient resting in no acute distress.  Updated her of all test results.  Will start her on  Carafate, Pepcid in addition to omeprazole and refer to GI for outpatient work-up.  Strict return precautions given.  Patient verbalizes understanding and agrees with plan of care.   [JS]    Clinical Course User Index [JS] Irean Hong, MD     ____________________________________________   FINAL CLINICAL IMPRESSION(S) / ED DIAGNOSES  Final diagnoses:  Epigastric pain  Acute gastritis without hemorrhage, unspecified gastritis type     ED Discharge Orders    None       Note:  This document was prepared using Dragon voice recognition software and may include unintentional dictation errors.   Irean Hong, MD 01/19/19 [phone]

## 2019-01-19 NOTE — Discharge Instructions (Signed)
Take the following medicines in addition to your daily Omeprazole: Pepcid 20mg  twice daily Carafate 2.  Return to the ER for worsening symptoms, persistent vomiting, difficulty breathing or other concerns.

## 2019-10-14 IMAGING — US ULTRASOUND ABDOMEN LIMITED
1 series · 14 of 25 positions shown · non-contrast
Comparison: Two-view abdomen 04/11/2017

CLINICAL DATA: Epigastric pain for 6 weeks.

EXAM:
ULTRASOUND ABDOMEN LIMITED RIGHT UPPER QUADRANT

[Series 1: ultrasound abdomen limited · 14 of 45 slices shown]
[im 1/45]
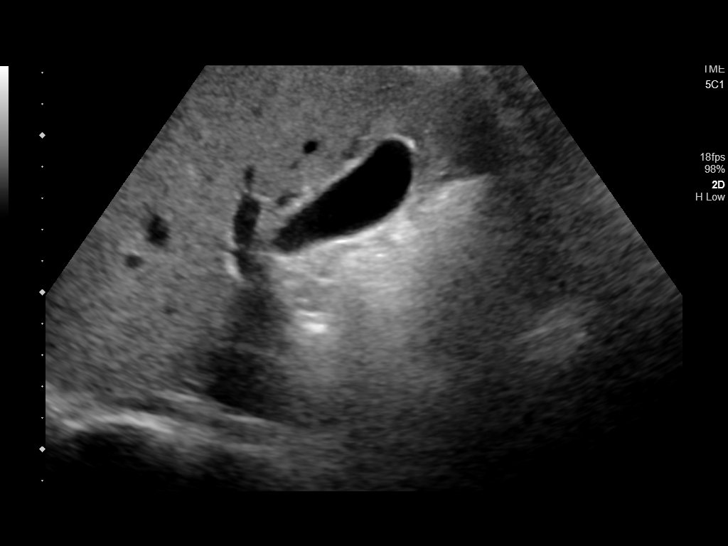
[im 4/45]
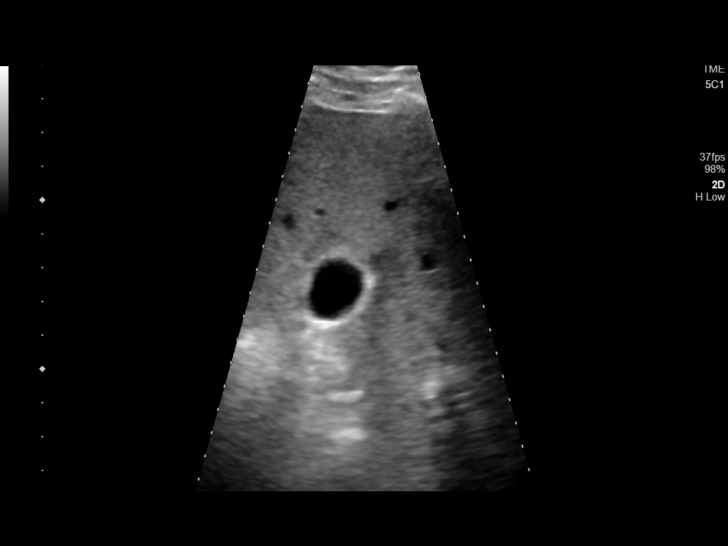
[im 8/45]
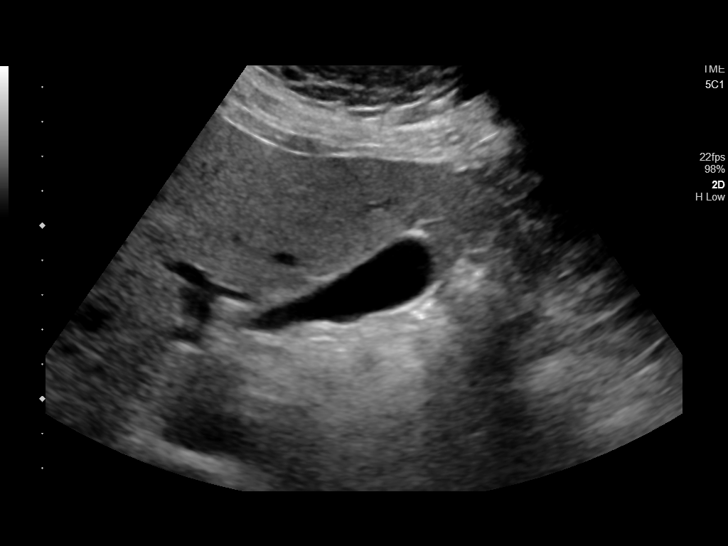
[im 12/45]
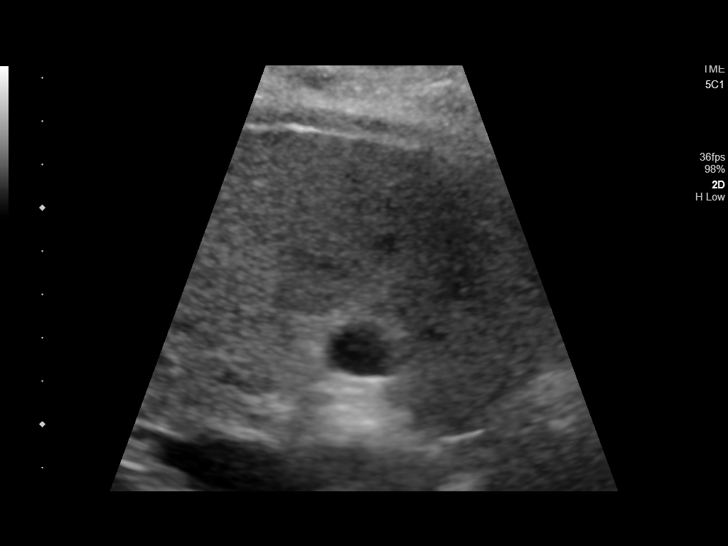
[im 15/45]
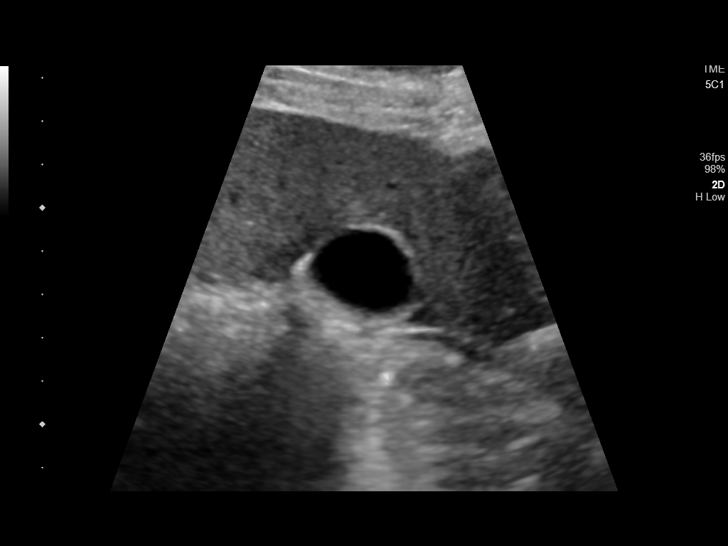
[im 17/45]
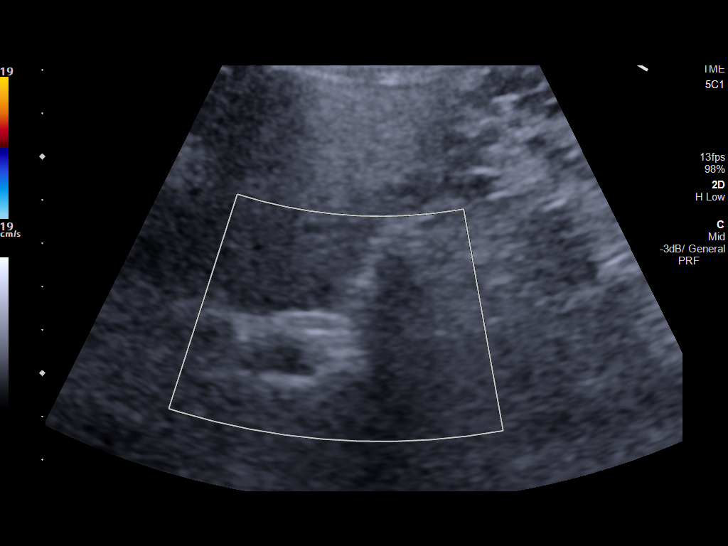
[im 21/45]
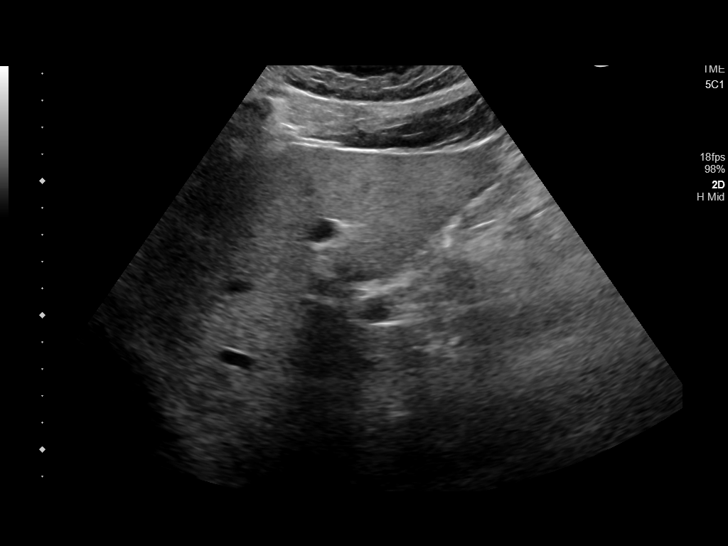
[im 24/45]
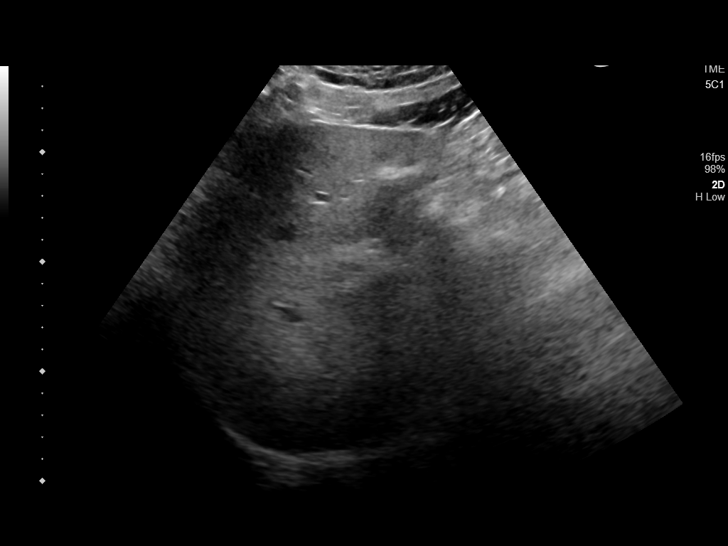
[im 28/45]
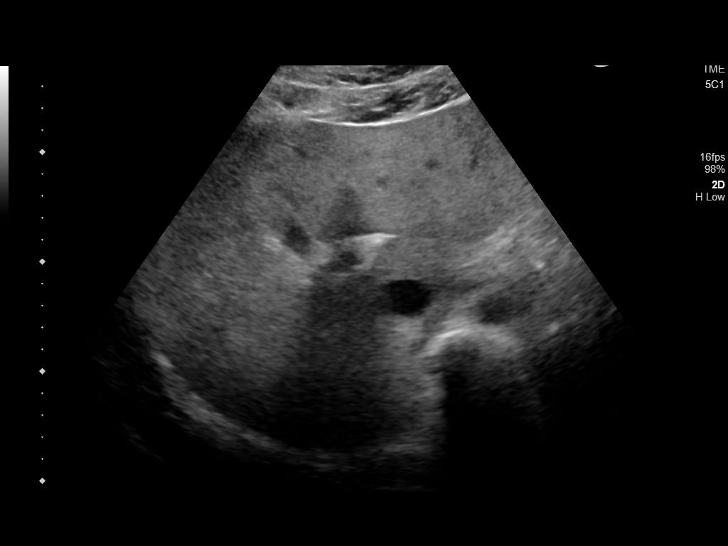
[im 30/45]
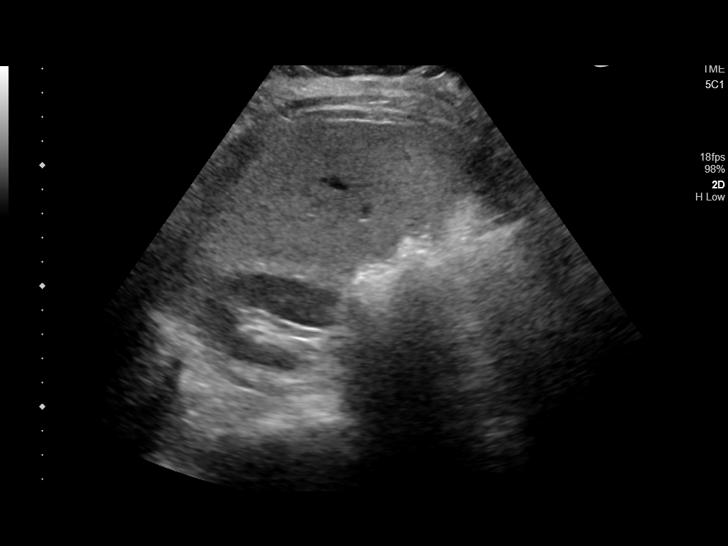
[im 34/45]
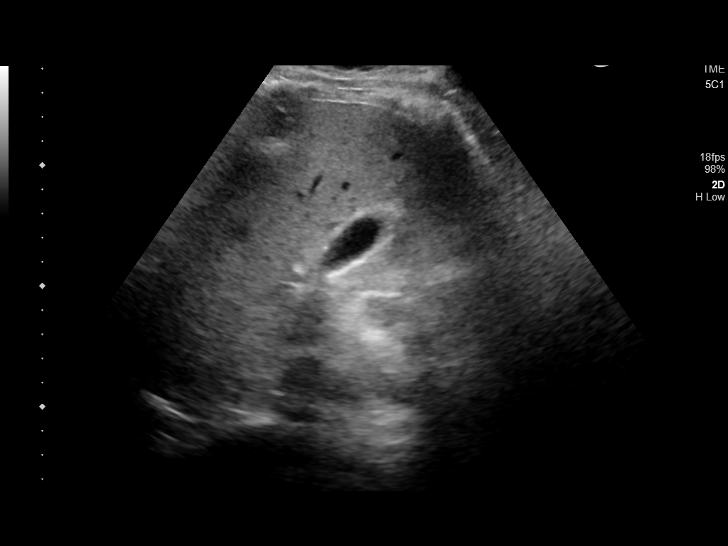
[im 37/45]
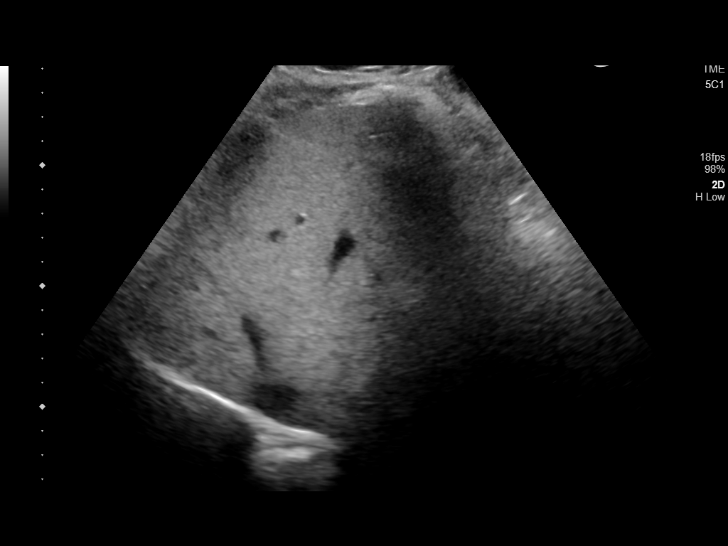
[im 41/45]
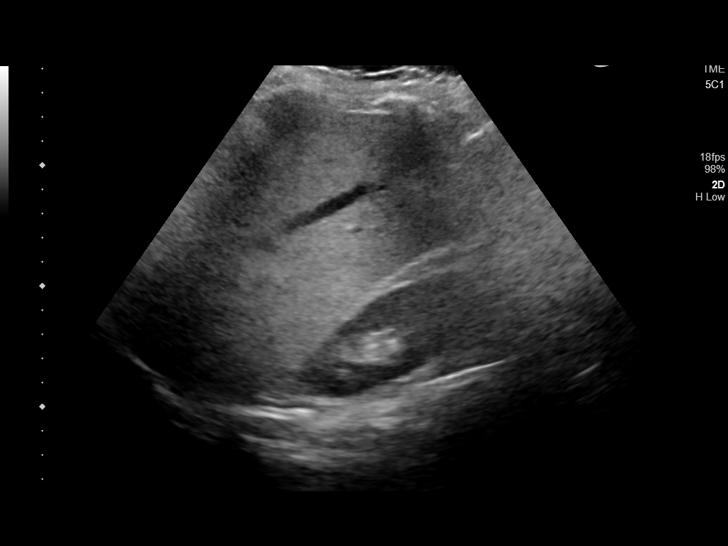
[im 45/45]
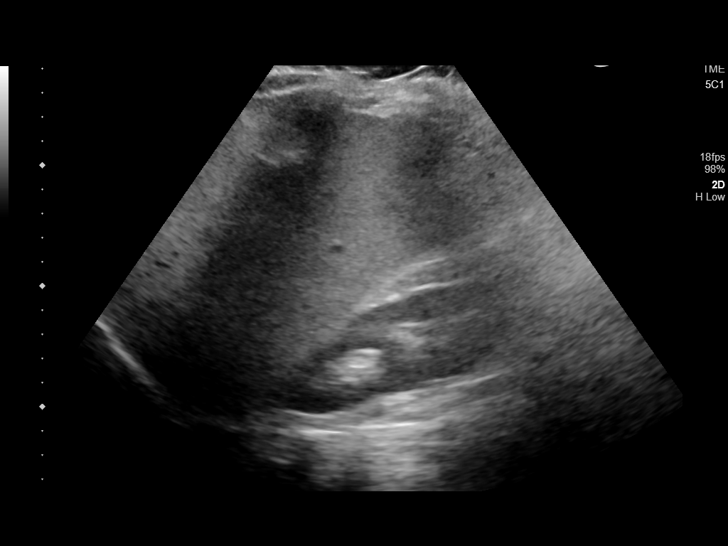

[14 of 25 positions shown; findings below may reference images not displayed]

FINDINGS: Gallbladder:

No gallstones or wall thickening visualized. No sonographic Murphy
sign noted by sonographer. Maximal wall thickness is 1.9 mm, within
normal limits

Common bile duct:

Diameter: 3.1 mm, within normal limits

Liver:

Liver is mildly echogenic. No discrete lesions are present. There is
no intrahepatic biliary dilation. Portal vein is patent on color
Doppler imaging with normal direction of blood flow towards the
liver.

Other: None.
IMPRESSION: 1. Acute abnormality.
2. Normal appearance of the gallbladder and common bile duct.
3. Hepatic steatosis.

## 2022-05-31 ENCOUNTER — Other Ambulatory Visit: Payer: Self-pay | Admitting: Family Medicine

## 2022-05-31 DIAGNOSIS — N644 Mastodynia: Secondary | ICD-10-CM

## 2022-05-31 DIAGNOSIS — Z1231 Encounter for screening mammogram for malignant neoplasm of breast: Secondary | ICD-10-CM

## 2022-06-12 ENCOUNTER — Ambulatory Visit
Admission: RE | Admit: 2022-06-12 | Discharge: 2022-06-12 | Disposition: A | Discharge status: Home / Self Care | Payer: 59 | Source: Ambulatory Visit | Attending: Family Medicine | Admitting: Family Medicine

## 2022-06-12 DIAGNOSIS — Z1231 Encounter for screening mammogram for malignant neoplasm of breast: Secondary | ICD-10-CM | POA: Diagnosis present

## 2022-06-12 DIAGNOSIS — N644 Mastodynia: Secondary | ICD-10-CM | POA: Insufficient documentation

## 2022-07-24 LAB — OB RESULTS CONSOLE ABO/RH: RH Type: POSITIVE

## 2022-07-24 LAB — OB RESULTS CONSOLE GC/CHLAMYDIA
Chlamydia: NEGATIVE
Neisseria Gonorrhea: NEGATIVE

## 2022-07-24 LAB — OB RESULTS CONSOLE RPR: RPR: NONREACTIVE

## 2022-07-24 LAB — OB RESULTS CONSOLE HIV ANTIBODY (ROUTINE TESTING): HIV: NONREACTIVE

## 2022-07-24 LAB — OB RESULTS CONSOLE HEPATITIS B SURFACE ANTIGEN: Hepatitis B Surface Ag: NEGATIVE

## 2022-08-31 ENCOUNTER — Other Ambulatory Visit: Payer: Self-pay | Admitting: Primary Care

## 2022-08-31 DIAGNOSIS — Z3481 Encounter for supervision of other normal pregnancy, first trimester: Secondary | ICD-10-CM

## 2022-09-05 ENCOUNTER — Other Ambulatory Visit: Payer: Self-pay | Admitting: Primary Care

## 2022-09-05 DIAGNOSIS — Z3482 Encounter for supervision of other normal pregnancy, second trimester: Secondary | ICD-10-CM

## 2022-09-05 DIAGNOSIS — Z3689 Encounter for other specified antenatal screening: Secondary | ICD-10-CM

## 2022-09-06 ENCOUNTER — Ambulatory Visit: Admission: RE | Admit: 2022-09-06 | Payer: 59 | Source: Ambulatory Visit

## 2022-10-16 ENCOUNTER — Ambulatory Visit: Admission: RE | Admit: 2022-10-16 | Payer: 59 | Source: Ambulatory Visit

## 2022-10-19 ENCOUNTER — Ambulatory Visit
Admission: RE | Admit: 2022-10-19 | Discharge: 2022-10-19 | Disposition: A | Discharge status: Home / Self Care | Payer: 59 | Source: Ambulatory Visit | Attending: Primary Care | Admitting: Primary Care

## 2022-10-19 DIAGNOSIS — Z3A20 20 weeks gestation of pregnancy: Secondary | ICD-10-CM | POA: Insufficient documentation

## 2022-10-19 DIAGNOSIS — Z3689 Encounter for other specified antenatal screening: Secondary | ICD-10-CM | POA: Insufficient documentation

## 2022-10-19 DIAGNOSIS — O321XX0 Maternal care for breech presentation, not applicable or unspecified: Secondary | ICD-10-CM | POA: Insufficient documentation

## 2022-10-19 DIAGNOSIS — Z3482 Encounter for supervision of other normal pregnancy, second trimester: Secondary | ICD-10-CM | POA: Insufficient documentation

## 2023-02-06 LAB — OB RESULTS CONSOLE GBS: GBS: NEGATIVE

## 2023-02-21 ENCOUNTER — Other Ambulatory Visit: Payer: Self-pay | Admitting: Primary Care

## 2023-02-21 DIAGNOSIS — Z3483 Encounter for supervision of other normal pregnancy, third trimester: Secondary | ICD-10-CM

## 2023-02-25 ENCOUNTER — Encounter: Payer: Self-pay | Admitting: *Deleted

## 2023-02-26 ENCOUNTER — Encounter: Payer: Self-pay | Admitting: *Deleted

## 2023-02-26 ENCOUNTER — Encounter: Payer: 59 | Attending: Primary Care

## 2023-02-26 ENCOUNTER — Ambulatory Visit: Payer: 59 | Admitting: *Deleted

## 2023-02-26 DIAGNOSIS — Z3A39 39 weeks gestation of pregnancy: Secondary | ICD-10-CM | POA: Insufficient documentation

## 2023-02-26 DIAGNOSIS — O26843 Uterine size-date discrepancy, third trimester: Secondary | ICD-10-CM | POA: Diagnosis not present

## 2023-02-26 DIAGNOSIS — Z3483 Encounter for supervision of other normal pregnancy, third trimester: Secondary | ICD-10-CM

## 2023-02-26 DIAGNOSIS — O34219 Maternal care for unspecified type scar from previous cesarean delivery: Secondary | ICD-10-CM | POA: Diagnosis not present

## 2023-02-27 ENCOUNTER — Encounter: Payer: Self-pay | Admitting: Obstetrics and Gynecology

## 2023-02-27 ENCOUNTER — Observation Stay
Admission: EM | Admit: 2023-02-27 | Discharge: 2023-02-28 | Disposition: A | Discharge status: Home / Self Care | Payer: 59 | Source: Home / Self Care | Admitting: Obstetrics and Gynecology

## 2023-02-27 ENCOUNTER — Other Ambulatory Visit: Payer: Self-pay

## 2023-02-27 DIAGNOSIS — O471 False labor at or after 37 completed weeks of gestation: Principal | ICD-10-CM | POA: Insufficient documentation

## 2023-02-27 DIAGNOSIS — O479 False labor, unspecified: Principal | ICD-10-CM | POA: Diagnosis present

## 2023-02-27 DIAGNOSIS — Z3A39 39 weeks gestation of pregnancy: Secondary | ICD-10-CM | POA: Insufficient documentation

## 2023-02-27 NOTE — OB Triage Note (Signed)
Patient is G2P1 is [redacted]w[redacted]d seen at [facility] but was seen by Dr. Dalbert Garnet previously. Patient had a membrane sweep today. States CTX started to get stronger around 2pm and then more gradual into the night. Patient is a previous TOLAC. . Monitors applied FHR 145 cat 1. Providers to be notified.

## 2023-02-28 ENCOUNTER — Other Ambulatory Visit: Payer: Self-pay

## 2023-02-28 ENCOUNTER — Inpatient Hospital Stay
Admission: EM | Admit: 2023-02-28 | Discharge: 2023-03-03 | DRG: 787 | Disposition: A | Discharge status: Home / Self Care | Payer: 59 | Attending: Certified Nurse Midwife | Admitting: Certified Nurse Midwife

## 2023-02-28 ENCOUNTER — Encounter: Payer: Self-pay | Admitting: Obstetrics and Gynecology

## 2023-02-28 DIAGNOSIS — Z3A39 39 weeks gestation of pregnancy: Secondary | ICD-10-CM | POA: Diagnosis not present

## 2023-02-28 DIAGNOSIS — O471 False labor at or after 37 completed weeks of gestation: Secondary | ICD-10-CM | POA: Diagnosis present

## 2023-02-28 DIAGNOSIS — Z603 Acculturation difficulty: Secondary | ICD-10-CM | POA: Diagnosis present

## 2023-02-28 DIAGNOSIS — O3663X0 Maternal care for excessive fetal growth, third trimester, not applicable or unspecified: Secondary | ICD-10-CM | POA: Diagnosis present

## 2023-02-28 DIAGNOSIS — O26893 Other specified pregnancy related conditions, third trimester: Secondary | ICD-10-CM | POA: Diagnosis present

## 2023-02-28 DIAGNOSIS — D62 Acute posthemorrhagic anemia: Secondary | ICD-10-CM | POA: Diagnosis not present

## 2023-02-28 DIAGNOSIS — O99210 Obesity complicating pregnancy, unspecified trimester: Secondary | ICD-10-CM | POA: Diagnosis present

## 2023-02-28 DIAGNOSIS — O479 False labor, unspecified: Principal | ICD-10-CM | POA: Diagnosis present

## 2023-02-28 DIAGNOSIS — O34211 Maternal care for low transverse scar from previous cesarean delivery: Secondary | ICD-10-CM | POA: Diagnosis present

## 2023-02-28 DIAGNOSIS — O99214 Obesity complicating childbirth: Secondary | ICD-10-CM | POA: Diagnosis present

## 2023-02-28 DIAGNOSIS — O9081 Anemia of the puerperium: Secondary | ICD-10-CM | POA: Diagnosis not present

## 2023-02-28 DIAGNOSIS — Z98891 History of uterine scar from previous surgery: Principal | ICD-10-CM

## 2023-02-28 LAB — CBC
HCT: 35.3 % — ABNORMAL LOW (ref 36.0–46.0)
Hemoglobin: 11.9 g/dL — ABNORMAL LOW (ref 12.0–15.0)
MCH: 29.4 pg (ref 26.0–34.0)
MCHC: 33.7 g/dL (ref 30.0–36.0)
MCV: 87.2 fL (ref 80.0–100.0)
Platelets: 207 10*3/uL (ref 150–400)
RBC: 4.05 MIL/uL (ref 3.87–5.11)
RDW: 14 % (ref 11.5–15.5)
WBC: 7.8 10*3/uL (ref 4.0–10.5)
nRBC: 0 % (ref 0.0–0.2)

## 2023-02-28 MED ORDER — FENTANYL CITRATE (PF) 100 MCG/2ML IJ SOLN: 50.0000 ug | INTRAMUSCULAR | Status: DC | PRN

## 2023-02-28 MED ORDER — LACTATED RINGERS IV SOLN: INTRAVENOUS | Status: DC

## 2023-02-28 MED ORDER — SODIUM CHLORIDE 0.9 % IV SOLN: 250.0000 mL | INTRAVENOUS | Status: DC | PRN

## 2023-02-28 MED ORDER — ACETAMINOPHEN 500 MG PO TABS
1000.0000 mg | ORAL_TABLET | Freq: Four times a day (QID) | ORAL | Status: DC | PRN
  Administered 2023-03-01: 1000 mg via ORAL
  Filled 2023-02-28: qty 2

## 2023-02-28 MED ORDER — LIDOCAINE HCL (PF) 1 % IJ SOLN: 30.0000 mL | INTRAMUSCULAR | Status: DC | PRN

## 2023-02-28 MED ORDER — LACTATED RINGERS IV SOLN: 500.0000 mL | INTRAVENOUS | Status: DC | PRN

## 2023-02-28 MED ORDER — SODIUM CHLORIDE 0.9% FLUSH
3.0000 mL | Freq: Two times a day (BID) | INTRAVENOUS | Status: DC
  Administered 2023-03-01: 3 mL via INTRAVENOUS

## 2023-02-28 MED ORDER — OXYTOCIN-SODIUM CHLORIDE 30-0.9 UT/500ML-% IV SOLN
2.5000 [IU]/h | INTRAVENOUS | Status: DC
  Filled 2023-02-28 (×3): qty 500

## 2023-02-28 MED ORDER — SOD CITRATE-CITRIC ACID 500-334 MG/5ML PO SOLN: 30.0000 mL | ORAL | Status: DC | PRN

## 2023-02-28 MED ORDER — SODIUM CHLORIDE 0.9% FLUSH: 3.0000 mL | INTRAVENOUS | Status: DC | PRN

## 2023-02-28 MED ORDER — ONDANSETRON HCL 4 MG/2ML IJ SOLN: 4.0000 mg | Freq: Four times a day (QID) | INTRAMUSCULAR | Status: DC | PRN

## 2023-02-28 MED ORDER — OXYTOCIN BOLUS FROM INFUSION: 333.0000 mL | Freq: Once | INTRAVENOUS | Status: DC

## 2023-02-28 NOTE — OB Triage Note (Signed)
G2P1 presents at 39.6 with c/o increased UCs since 7pm. Pt is a previs C/S

## 2023-02-28 NOTE — Discharge Summary (Signed)
Patient ID: Emily Ruiz MRN: [id number] DOB/AGE: 07/27/1990 32 y.o.  Admit date: 02/27/2023 Discharge date: 02/28/2023  Admission Diagnoses: 32yo G2P1 at [redacted]w[redacted]d presents with uterine contractions since about 1400 yesterday. Denies VB or LOF. Reports GFM.  Discharge Diagnoses: Early labor vs false labor  Factors complicating pregnancy: Previous c/s   Prenatal Procedures: NST  Consults: None  Significant Diagnostic Studies:  No results found for this or any previous visit (from the past 168 hour(s)).  Treatments: none  Hospital Course:  Pt reports uterine contractions since 1400 yesterday. No leaking of fluid and no bleeding.  She was observed for 2 hours with no cervical change, fetal heart rate monitoring remained reassuring, and she had no signs/symptoms of active labor or other maternal-fetal concerns.  Her cervical exam was unchanged from admission.  She was deemed stable for discharge to home with outpatient follow up.  Discharge Physical Exam:  BP 127/73 (BP Location: Right Arm)   Pulse 97   Temp 98.9 F (37.2 C) (Oral)   Wt 109.3 kg   LMP 05/25/2022   BMI 42.69 kg/m   NST: FHR baseline: 140 bpm Variability: moderate Accelerations: yes Decelerations: none Time: 20 minutes Category/reactivity: reactive   TOCO: every 3 mins SVE:  Dilation: 2.5 Effacement (%): 60 Cervical Position: Posterior Station: -3 Exam by:: Katie A RN   Discharge Condition: Stable  Disposition: Discharge disposition: 01-Home or Self Care        Allergies as of 02/28/2023   No Known Allergies      Medication List     TAKE these medications    multivitamin-prenatal 27-0.8 MG Tabs tablet Take 1 tablet by mouth daily at 12 noon.        Follow-up Information     [facility] Follow up.   Specialty: General Practice Why: Keep all scheduled appointments Contact information: [address] [place]  [address] [phone]                 Signed:  Haroldine Laws, CNM 02/28/2023 12:54 AM

## 2023-02-28 NOTE — H&P (Signed)
OB History & Physical   History of Present Illness:   Chief Complaint: Contractions   HPI:  Emily Ruiz is a 32 y.o. G2P1001 female at [redacted]w[redacted]d, Patient's last menstrual period was 05/25/2022., consistent with [place] at [redacted]w[redacted]d, with Estimated Date of Delivery: 03/01/23.  She presents to L&D for contractions.  States they happen more at night. Was seen in L&D triage last night for contractions with no cervical change. Her pregnancy is complicated by previous cesarean birth for chorioamnionitis and NRFHR, obesity, and new dx of macrosomia .  She had an ultrasound done on 02/26/2023 that estimated baby to be 4082 grams.  She had originally considered a TOLAC but now desires a repeat cesarean delivery.  She denies Loss of fluid or Vaginal bleeding. Endorses fetal movement as active.   Reports active fetal movement  Contractions:  Irregular, mild contractions  LOF/SROM: denies  Vaginal bleeding: denies   Factors complicating pregnancy:  Principal Problem:   Indication for care in labor or delivery Active Problems:   Previous cesarean section   Obesity affecting pregnancy   Macrosomia    Patient Active Problem List   Diagnosis Date Noted   Indication for care in labor or delivery 02/28/2023   Previous cesarean section 02/28/2023   Obesity affecting pregnancy 02/28/2023   Macrosomia 02/28/2023    Prenatal Transfer Tool  Maternal Diabetes: No Genetic Screening: Unknown  Maternal Ultrasounds/Referrals: Other: limited cardiac views  Fetal Ultrasounds or other Referrals:  Referred to Materal Fetal Medicine  Maternal Substance Abuse:  No Significant Maternal Medications:  None Significant Maternal Lab Results: Group B Strep negative  Maternal Medical History:   Past Medical History:  Diagnosis Date   Cesarean delivery delivered 04/09/2017   Chorioamnionitis 04/09/2017   Gastritis    GERD (gastroesophageal reflux disease)     Past Surgical History:  Procedure Laterality Date    CESAREAN SECTION N/A 04/09/2017   Procedure: CESAREAN SECTION;  Surgeon: Natale Milch, MD;  Location: [facility] ORS;  Service: Obstetrics;  Laterality: N/A;    No Known Allergies  Prior to Admission medications   Medication Sig Start Date End Date Taking? Authorizing Provider  Prenatal Vit-Fe Fumarate-FA (MULTIVITAMIN-PRENATAL) 27-0.8 MG TABS tablet Take 1 tablet by mouth daily at 12 noon.    [provider]     Prenatal care site:  [facility]  OB History  Gravida Para Term Preterm AB Living  2 1 1  0 0 1  SAB IAB Ectopic Multiple Live Births  0 0 0 0 1    # Outcome Date GA Lbr Len/2nd Weight Sex Type Anes PTL Lv  2 Current           1 Term 04/09/17 [redacted]w[redacted]d  3590 g F CS-LTranv Spinal, Gen  LIV     Name: sara noemi     Apgar1: 4  Apgar5: 6     Social History: She  reports that she has never smoked. She has never used smokeless tobacco. She reports that she does not drink alcohol and does not use drugs.  Family History: family history is not on file.   Review of Systems: A full review of systems was performed and negative except as noted in the HPI.     Physical Exam:  Vital Signs: BP 130/79   Pulse 95   Temp 99 F (37.2 C) (Oral)   Resp 16   Ht 5\' 3"  (1.6 m)   Wt 109.3 kg   LMP 05/25/2022   BMI 42.69  kg/m   General: no acute distress.  Lungs: normal respiratory effort Abdomen: soft, gravid, non-tender;  EFW: 9 lbs  Pelvic:   External: Normal external female genitalia  Cervix: Dilation: 1.5 / Effacement (%): 50 / Station: -3  Neurologic: Alert & oriented x 3.    No results found for this or any previous visit (from the past 24 hour(s)).  Pertinent Results:  Prenatal Labs: Records requested  Blood type/Rh O POS   Antibody screen Negative    Rubella Unknown   Varicella Unknown   RPR Unknown   HBsAg Unknown   Hep C Unknown   HIV Unknown   GC neg  Chlamydia neg  Genetic screening Unknown   1 hour GTT Unknown   3 hour GTT Unknown   GBS  Negative    FHT:  FHR: 135 bpm, variability: moderate,  accelerations:  Present,  decelerations:  Absent Category/reactivity:  Category I UC:   Irregular, mild contractions   Cephalic by SVE and [place] on 02/26/2023   [place] MFM OB DETAIL +14 WK  Result Date: 02/26/2023 ----------------------------------------------------------------------  OBSTETRICS REPORT                       (Signed Final 02/26/2023 02:20 pm) ---------------------------------------------------------------------- Patient Info  ID #:       [id number]                          D.O.B.:  28-Sep-1990 (32 yrs)  Name:       Emily Ruiz            Visit Date: 02/26/2023 01:23 pm ---------------------------------------------------------------------- Performed By  Attending:        Noralee Space MD        Ref. Address:     [address],                                                             [place], [place]                                                             [address]  Performed By:     Dennis Bast RDMS      [facility]  Referred By:      Sandrea Hughs                    WHNP ---------------------------------------------------------------------- Orders  #  Description                           Code        Ordered By  1  [place] MFM OB DETAIL +14 WK               76811.01    JESSICA RUBIO ----------------------------------------------------------------------  #  Order #                     Accession #                Episode #  1  [id number]                   [id number]                 [id number] ---------------------------------------------------------------------- Indications  Uterine size-date discrepancy, third trimester O26.843  History of  cesarean delivery, currently        O34.219  pregnant  [redacted] weeks gestation of pregnancy                Z3A.39 ---------------------------------------------------------------------- Vital Signs  BP:          130/74 ---------------------------------------------------------------------- Fetal Evaluation  Num Of Fetuses:         1  Fetal Heart Rate(bpm):  145  Cardiac Activity:       Observed  Presentation:           Cephalic  Placenta:               Anterior  P. Cord Insertion:      Visualized, central  Amniotic Fluid  AFI FV:      Within normal limits  AFI Sum(cm)     %Tile       Largest Pocket(cm)  16.34           68          6.29  RUQ(cm)       RLQ(cm)       LUQ(cm)        LLQ(cm)  6.29          5.16          0              4.89 ---------------------------------------------------------------------- Biometry  BPD:      95.1  mm     G. Age:  38w 6d         69  %    CI:        78.13   %    70 - 86  FL/HC:      22.2   %    20.7 - 22.5  HC:      340.4  mm     G. Age:  39w 1d         34  %    HC/AC:      0.90        0.87 - 1.06  AC:      377.8  mm     G. Age:  41w 5d         99  %    FL/BPD:     79.3   %    71 - 87  FL:       75.4  mm     G. Age:  38w 4d         34  %    FL/AC:      20.0   %    20 - 24  HUM:      64.3  mm     G. Age:  37w 2d         36  %  Est. FW:    4082  gm           9 lb     88  % ---------------------------------------------------------------------- OB History  Blood Type:   O+  Gravidity:    2  Living:       1 ---------------------------------------------------------------------- Gestational Age  LMP:           39w 4d        Date:  05/25/22                   EDD:   03/01/23  U/S Today:     39w 4d                                        EDD:   03/01/23  Best:          39w 4d     Det. By:  LMP  (05/25/22)          EDD:   03/01/23 ---------------------------------------------------------------------- Targeted Anatomy  Central Nervous System   Calvarium/Cranial V.:  Appears normal         Cereb./Vermis:          Not well visualized  Cavum:                 Appears normal         Cisterna Magna:         Not well visualized  Lateral Ventricles:    Not well visualized    Midline Falx:           Appears normal  Choroid Plexus:        Not well visualized  Spine  Cervical:              Not well visualized    Sacral:                 Not well visualized  Thoracic:              Not well visualized    Shape/Curvature:        Not well visualized  Lumbar:                Not well visualized  Head/Neck  Lips:  Appears normal         Profile:                Appears normal  Neck:                  Appears normal         Orbits/Eyes:            Appears normal  Nuchal Fold:           Not applicable         Mandible:               Not well visualized  Nasal Bone:            Present                Maxilla:                Not well visualized  Thorax  4 Chamber View:        Appears normal         Interventr. Septum:     Not well visualized  Cardiac Rhythm:        Normal                 Cardiac Axis:           Normal  Cardiac Situs:         Appears normal         Diaphragm:              Appears normal  Rt Outflow Tract:      Appears normal         3 Vessel View:          Not well visualized  Lt Outflow Tract:      Not well visualized    3 V Trachea View:       Appears normal  Aortic Arch:           Not well visualized    IVC:                    Not well visualized  Ductal Arch:           Appears normal         Crossing:               Not well visualized  SVC:                   Not well visualized  Abdomen  Ventral Wall:          Appears normal         Lt Kidney:              Appears normal  Cord Insertion:        Not well visualized    Rt Kidney:              Appears normal  Situs:                 Appears normal         Bladder:                Appears normal  Stomach:               Appears normal  Extremities  Lt Humerus:            Appears normal  Lt  Femur:               Appears normal  Rt Humerus:            Not well visualized    Rt Femur:               Not well visualized  Lt Forearm:            Appears normal         Lt Lower Leg:           Appears normal  Rt Forearm:            Not well visualized    Rt Lower Leg:           Not well visualized  Lt Hand:               Open hand nml          Lt Foot:                Not well visualized  Rt Hand:               Not well visualized    Rt Foot:                Not well visualized  Other  Umbilical Cord:        Normal 3-vessel ---------------------------------------------------------------------- Cervix Uterus Adnexa  Cervix  Not visualized (advanced GA >24wks)  Uterus  No abnormality visualized.  Right Ovary  Not visualized.  Left Ovary  Not visualized.  Cul De Sac  No free fluid seen.  Adnexa  No abnormality visualized ---------------------------------------------------------------------- Impression  Uterine-size-date discrepancy.  Obstetrical history significant for a term cesarean delivery.  The estimated fetal weight is at the 4,082 grams or 9 lbs  (88th percentile) and the abdominal circumference  measurement is at the 99 percentile.  Cephalic presentation.  Amniotic fluid is normal and good fetal activity seen.  Fetal  anatomical survey is very limited because of advanced  gestational age.  Placenta is anterior and there is no evidence of previa or  placenta accreta spectrum.  I counseled the patient that ultrasound has limitations in  accurately estimating fetal weights.  Patient is planning  vaginal birth (TOLAC). ---------------------------------------------------------------------- Recommendations  -No follow-up appointments were made . ----------------------------------------------------------------------                 Noralee Space, MD Electronically Signed Final Report   02/26/2023 02:20 pm ----------------------------------------------------------------------    Assessment:  Rocky Crafts is  a 32 y.o. G2P1001 female at [redacted]w[redacted]d with previous cesarean delivery with contractions and fetal macrosomia.   Plan:  1. Admit to Labor & Delivery - Admission status: Inpatient - Dr Virgel Manifold MD notified of admission and plan of care  - Reason for admission: scheduled repeat c/section   - consents reviewed and obtained  2. Fetal Well being  - Fetal Tracing: cat 1 - Group B Streptococcus ppx not indicated: GBS negative - Presentation: cephalic confirmed by [place] on 02/26/2023   3. Routine OB: - Prenatal labs reviewed, as above - Rh positive - CBC, T&S, RPR on admit - NST now and in AM  - Limited prenatal records available.  Prenatal labs requested from [facility]. Will request prenatal records from [facility] in AM   4.  Repeat cesarean   - Requesting repeat cesarean delivery d/t new diagnosis of fetal macrosomia - Will plan for scheduled cesarean in AM  d/t term gestation at 40 weeks at time of delivery  - Contractions monitored with external toco - Anesthesia plans per OB anesthesia team   5. Post Partum Planning: - Infant feeding: breast feeding - Contraception: condoms - Flu vaccine:  Unknown  - Tdap vaccine:  Unknown  - RSV vaccine: Unknown  Gustavo Lah, CNM 02/28/23 11:11 PM  Margaretmary Eddy, CNM Certified Nurse Midwife [facility]

## 2023-03-01 ENCOUNTER — Inpatient Hospital Stay: Payer: 59 | Admitting: Certified Registered Nurse Anesthetist

## 2023-03-01 ENCOUNTER — Encounter
Admission: EM | Disposition: A | Discharge status: Home / Self Care | Payer: Self-pay | Source: Home / Self Care | Attending: Certified Nurse Midwife

## 2023-03-01 ENCOUNTER — Encounter: Payer: Self-pay | Admitting: Obstetrics and Gynecology

## 2023-03-01 ENCOUNTER — Other Ambulatory Visit: Payer: Self-pay

## 2023-03-01 LAB — FIBRINOGEN: Fibrinogen: 391 mg/dL (ref 210–475)

## 2023-03-01 LAB — RAPID HIV SCREEN (HIV 1/2 AB+AG)
HIV 1/2 Antibodies: NONREACTIVE
HIV-1 P24 Antigen - HIV24: NONREACTIVE

## 2023-03-01 LAB — RPR: RPR Ser Ql: NONREACTIVE

## 2023-03-01 LAB — CBC
HCT: 34.3 % — ABNORMAL LOW (ref 36.0–46.0)
HCT: 33.8 % — ABNORMAL LOW (ref 36.0–46.0)
Hemoglobin: 11.5 g/dL — ABNORMAL LOW (ref 12.0–15.0)
Hemoglobin: 11.5 g/dL — ABNORMAL LOW (ref 12.0–15.0)
MCH: 29.9 pg (ref 26.0–34.0)
MCH: 30.1 pg (ref 26.0–34.0)
MCHC: 33.5 g/dL (ref 30.0–36.0)
MCHC: 34 g/dL (ref 30.0–36.0)
MCV: 89.1 fL (ref 80.0–100.0)
MCV: 88.5 fL (ref 80.0–100.0)
Platelets: 195 10*3/uL (ref 150–400)
Platelets: 205 10*3/uL (ref 150–400)
RBC: 3.85 MIL/uL — ABNORMAL LOW (ref 3.87–5.11)
RBC: 3.82 MIL/uL — ABNORMAL LOW (ref 3.87–5.11)
RDW: 14.5 % (ref 11.5–15.5)
RDW: 14.4 % (ref 11.5–15.5)
WBC: 10.8 10*3/uL — ABNORMAL HIGH (ref 4.0–10.5)
WBC: 14.9 10*3/uL — ABNORMAL HIGH (ref 4.0–10.5)
nRBC: 0 % (ref 0.0–0.2)
nRBC: 0 % (ref 0.0–0.2)

## 2023-03-01 LAB — PROTIME-INR
INR: 1.1 (ref 0.8–1.2)
Prothrombin Time: 14.7 s (ref 11.4–15.2)

## 2023-03-01 LAB — APTT: aPTT: 29 s (ref 24–36)

## 2023-03-01 LAB — PREPARE RBC (CROSSMATCH)

## 2023-03-01 SURGERY — Surgical Case
Anesthesia: Spinal

## 2023-03-01 MED ORDER — DIPHENHYDRAMINE HCL 50 MG/ML IJ SOLN: 12.5000 mg | INTRAMUSCULAR | Status: DC | PRN

## 2023-03-01 MED ORDER — KETOROLAC TROMETHAMINE 30 MG/ML IJ SOLN: 30.0000 mg | Freq: Four times a day (QID) | INTRAMUSCULAR | Status: AC | PRN

## 2023-03-01 MED ORDER — METHYLERGONOVINE MALEATE 0.2 MG/ML IJ SOLN: 0.2000 mg | Freq: Once | INTRAMUSCULAR | Status: AC

## 2023-03-01 MED ORDER — DIBUCAINE (PERIANAL) 1 % EX OINT: 1.0000 | TOPICAL_OINTMENT | CUTANEOUS | Status: DC | PRN

## 2023-03-01 MED ORDER — CEFAZOLIN SODIUM-DEXTROSE 2-4 GM/100ML-% IV SOLN
2.0000 g | Freq: Once | INTRAVENOUS | Status: AC
  Administered 2023-03-01: 2 g via INTRAVENOUS
  Filled 2023-03-01: qty 100

## 2023-03-01 MED ORDER — CEFAZOLIN SODIUM-DEXTROSE 2-4 GM/100ML-% IV SOLN
INTRAVENOUS | Status: AC
  Filled 2023-03-01: qty 100

## 2023-03-01 MED ORDER — EPHEDRINE 5 MG/ML INJ
INTRAVENOUS | Status: AC
  Filled 2023-03-01: qty 5

## 2023-03-01 MED ORDER — LIDOCAINE HCL (PF) 1 % IJ SOLN
INTRAMUSCULAR | Status: DC | PRN
  Administered 2023-03-01: 3 mL via SUBCUTANEOUS

## 2023-03-01 MED ORDER — SODIUM CHLORIDE 0.9% IV SOLUTION: Freq: Once | INTRAVENOUS | Status: DC

## 2023-03-01 MED ORDER — SODIUM CHLORIDE 0.9% FLUSH: 3.0000 mL | INTRAVENOUS | Status: DC | PRN

## 2023-03-01 MED ORDER — DIPHENHYDRAMINE HCL 25 MG PO CAPS: 25.0000 mg | ORAL_CAPSULE | ORAL | Status: DC | PRN

## 2023-03-01 MED ORDER — ACETAMINOPHEN 10 MG/ML IV SOLN: 1000.0000 mg | Freq: Once | INTRAVENOUS | Status: DC | PRN

## 2023-03-01 MED ORDER — WITCH HAZEL-GLYCERIN EX PADS: 1.0000 | MEDICATED_PAD | CUTANEOUS | Status: DC | PRN

## 2023-03-01 MED ORDER — FENTANYL CITRATE (PF) 100 MCG/2ML IJ SOLN
INTRAMUSCULAR | Status: AC
  Filled 2023-03-01: qty 2

## 2023-03-01 MED ORDER — SODIUM CHLORIDE 0.9 % IV SOLN
500.0000 mg | INTRAVENOUS | Status: AC
  Administered 2023-03-01: 500 mg via INTRAVENOUS
  Filled 2023-03-01: qty 5

## 2023-03-01 MED ORDER — SIMETHICONE 80 MG PO CHEW
80.0000 mg | CHEWABLE_TABLET | Freq: Three times a day (TID) | ORAL | Status: DC
  Administered 2023-03-02 – 2023-03-03 (×3): 80 mg via ORAL
  Filled 2023-03-01 (×3): qty 1

## 2023-03-01 MED ORDER — KETOROLAC TROMETHAMINE 30 MG/ML IJ SOLN
30.0000 mg | Freq: Four times a day (QID) | INTRAMUSCULAR | Status: AC | PRN
  Administered 2023-03-01: 30 mg via INTRAVENOUS
  Filled 2023-03-01: qty 1

## 2023-03-01 MED ORDER — SOD CITRATE-CITRIC ACID 500-334 MG/5ML PO SOLN: 30.0000 mL | ORAL | Status: AC

## 2023-03-01 MED ORDER — BUPIVACAINE IN DEXTROSE 0.75-8.25 % IT SOLN
INTRATHECAL | Status: DC | PRN
  Administered 2023-03-01: 1.5 mL via INTRATHECAL

## 2023-03-01 MED ORDER — PHENYLEPHRINE 80 MCG/ML (10ML) SYRINGE FOR IV PUSH (FOR BLOOD PRESSURE SUPPORT)
PREFILLED_SYRINGE | INTRAVENOUS | Status: AC
  Filled 2023-03-01: qty 10

## 2023-03-01 MED ORDER — FERROUS SULFATE 325 (65 FE) MG PO TABS
325.0000 mg | ORAL_TABLET | Freq: Two times a day (BID) | ORAL | Status: DC
  Administered 2023-03-02 – 2023-03-03 (×3): 325 mg via ORAL
  Filled 2023-03-01 (×3): qty 1

## 2023-03-01 MED ORDER — MORPHINE SULFATE (PF) 0.5 MG/ML IJ SOLN
INTRAMUSCULAR | Status: AC
  Filled 2023-03-01: qty 10

## 2023-03-01 MED ORDER — SOD CITRATE-CITRIC ACID 500-334 MG/5ML PO SOLN
ORAL | Status: AC
  Administered 2023-03-01: 30 mL via ORAL
  Filled 2023-03-01: qty 15

## 2023-03-01 MED ORDER — OXYTOCIN-SODIUM CHLORIDE 30-0.9 UT/500ML-% IV SOLN
INTRAVENOUS | Status: DC | PRN
  Administered 2023-03-01: 500 mL via INTRAVENOUS
  Administered 2023-03-01: 250 mL/h via INTRAVENOUS

## 2023-03-01 MED ORDER — MEASLES, MUMPS & RUBELLA VAC IJ SOLR
0.5000 mL | Freq: Once | INTRAMUSCULAR | Status: DC
  Filled 2023-03-01: qty 0.5

## 2023-03-01 MED ORDER — TRANEXAMIC ACID-NACL 1000-0.7 MG/100ML-% IV SOLN: 1000.0000 mg | INTRAVENOUS | Status: AC

## 2023-03-01 MED ORDER — METHYLERGONOVINE MALEATE 0.2 MG/ML IJ SOLN
INTRAMUSCULAR | Status: AC
  Filled 2023-03-01: qty 1

## 2023-03-01 MED ORDER — BUPIVACAINE HCL (PF) 0.25 % IJ SOLN
INTRAMUSCULAR | Status: AC
  Filled 2023-03-01: qty 60

## 2023-03-01 MED ORDER — SODIUM CHLORIDE 0.9% IV SOLUTION: Freq: Once | INTRAVENOUS | Status: AC

## 2023-03-01 MED ORDER — FLEET ENEMA RE ENEM: 1.0000 | ENEMA | Freq: Every day | RECTAL | Status: DC | PRN

## 2023-03-01 MED ORDER — PHENYLEPHRINE HCL-NACL 20-0.9 MG/250ML-% IV SOLN
INTRAVENOUS | Status: DC | PRN
  Administered 2023-03-01: 50 ug/min via INTRAVENOUS

## 2023-03-01 MED ORDER — CEFAZOLIN SODIUM-DEXTROSE 2-4 GM/100ML-% IV SOLN
2.0000 g | INTRAVENOUS | Status: AC
  Administered 2023-03-01 (×2): 2 g via INTRAVENOUS

## 2023-03-01 MED ORDER — BUPIVACAINE HCL (PF) 0.25 % IJ SOLN
INTRAMUSCULAR | Status: DC | PRN
  Administered 2023-03-01: 60 mL

## 2023-03-01 MED ORDER — METHYLERGONOVINE MALEATE 0.2 MG/ML IJ SOLN
INTRAMUSCULAR | Status: AC
  Administered 2023-03-01: 0.2 mg via INTRAMUSCULAR
  Filled 2023-03-01: qty 1

## 2023-03-01 MED ORDER — OXYCODONE HCL 5 MG/5ML PO SOLN: 5.0000 mg | Freq: Once | ORAL | Status: AC | PRN

## 2023-03-01 MED ORDER — NALOXONE HCL 0.4 MG/ML IJ SOLN: 0.4000 mg | INTRAMUSCULAR | Status: DC | PRN

## 2023-03-01 MED ORDER — DROPERIDOL 2.5 MG/ML IJ SOLN
0.6250 mg | Freq: Once | INTRAMUSCULAR | Status: DC | PRN
  Filled 2023-03-01: qty 2

## 2023-03-01 MED ORDER — DIPHENHYDRAMINE HCL 25 MG PO CAPS: 25.0000 mg | ORAL_CAPSULE | Freq: Four times a day (QID) | ORAL | Status: DC | PRN

## 2023-03-01 MED ORDER — FENTANYL CITRATE (PF) 100 MCG/2ML IJ SOLN: 25.0000 ug | INTRAMUSCULAR | Status: DC | PRN

## 2023-03-01 MED ORDER — ONDANSETRON HCL 4 MG/2ML IJ SOLN: 4.0000 mg | Freq: Three times a day (TID) | INTRAMUSCULAR | Status: DC | PRN

## 2023-03-01 MED ORDER — CEFAZOLIN IN SODIUM CHLORIDE 3-0.9 GM/100ML-% IV SOLN
INTRAVENOUS | Status: AC
  Filled 2023-03-01: qty 100

## 2023-03-01 MED ORDER — SENNOSIDES-DOCUSATE SODIUM 8.6-50 MG PO TABS
2.0000 | ORAL_TABLET | ORAL | Status: DC
  Administered 2023-03-02: 2 via ORAL
  Filled 2023-03-01 (×2): qty 2

## 2023-03-01 MED ORDER — IBUPROFEN 600 MG PO TABS
600.0000 mg | ORAL_TABLET | Freq: Four times a day (QID) | ORAL | Status: DC
  Administered 2023-03-03 (×2): 600 mg via ORAL
  Filled 2023-03-01 (×2): qty 1

## 2023-03-01 MED ORDER — KETOROLAC TROMETHAMINE 30 MG/ML IJ SOLN
INTRAMUSCULAR | Status: DC | PRN
  Administered 2023-03-01: 30 mg via INTRAVENOUS

## 2023-03-01 MED ORDER — OXYCODONE HCL 5 MG PO TABS
5.0000 mg | ORAL_TABLET | ORAL | Status: DC | PRN
  Administered 2023-03-02 (×2): 5 mg via ORAL
  Filled 2023-03-01 (×2): qty 1

## 2023-03-01 MED ORDER — 0.9 % SODIUM CHLORIDE (POUR BTL) OPTIME
TOPICAL | Status: DC | PRN
  Administered 2023-03-01: 1000 mL

## 2023-03-01 MED ORDER — ACETAMINOPHEN 500 MG PO TABS
1000.0000 mg | ORAL_TABLET | Freq: Four times a day (QID) | ORAL | Status: DC
  Administered 2023-03-01: 1000 mg via ORAL
  Filled 2023-03-01: qty 2

## 2023-03-01 MED ORDER — PRENATAL MULTIVITAMIN CH
1.0000 | ORAL_TABLET | Freq: Every day | ORAL | Status: DC
  Administered 2023-03-02 – 2023-03-03 (×2): 1 via ORAL
  Filled 2023-03-01 (×2): qty 1

## 2023-03-01 MED ORDER — CHLORHEXIDINE GLUCONATE 0.12 % MT SOLN
OROMUCOSAL | Status: AC
  Administered 2023-03-01: 15 mL
  Filled 2023-03-01: qty 15

## 2023-03-01 MED ORDER — TETANUS-DIPHTH-ACELL PERTUSSIS 5-2.5-18.5 LF-MCG/0.5 IM SUSY: 0.5000 mL | PREFILLED_SYRINGE | Freq: Once | INTRAMUSCULAR | Status: DC

## 2023-03-01 MED ORDER — SIMETHICONE 80 MG PO CHEW: 80.0000 mg | CHEWABLE_TABLET | ORAL | Status: DC | PRN

## 2023-03-01 MED ORDER — LACTATED RINGERS IV SOLN: INTRAVENOUS | Status: AC

## 2023-03-01 MED ORDER — KETOROLAC TROMETHAMINE 30 MG/ML IJ SOLN
30.0000 mg | Freq: Four times a day (QID) | INTRAMUSCULAR | Status: AC
  Administered 2023-03-02 (×3): 30 mg via INTRAVENOUS
  Filled 2023-03-01 (×3): qty 1

## 2023-03-01 MED ORDER — MENTHOL 3 MG MT LOZG: 1.0000 | LOZENGE | OROMUCOSAL | Status: DC | PRN

## 2023-03-01 MED ORDER — TRANEXAMIC ACID-NACL 1000-0.7 MG/100ML-% IV SOLN
INTRAVENOUS | Status: AC
  Administered 2023-03-01: 1000 mg via INTRAVENOUS
  Filled 2023-03-01: qty 100

## 2023-03-01 MED ORDER — OXYTOCIN-SODIUM CHLORIDE 30-0.9 UT/500ML-% IV SOLN
2.5000 [IU]/h | INTRAVENOUS | Status: AC
  Administered 2023-03-01 – 2023-03-02 (×2): 2.5 [IU]/h via INTRAVENOUS
  Filled 2023-03-01: qty 500

## 2023-03-01 MED ORDER — GABAPENTIN 300 MG PO CAPS
300.0000 mg | ORAL_CAPSULE | Freq: Every day | ORAL | Status: DC
  Administered 2023-03-02 (×2): 300 mg via ORAL
  Filled 2023-03-01 (×2): qty 1

## 2023-03-01 MED ORDER — DEXAMETHASONE SODIUM PHOSPHATE 10 MG/ML IJ SOLN
INTRAMUSCULAR | Status: DC | PRN
Start: 2023-03-01 — End: 2023-03-01
  Administered 2023-03-01: 10 mg via INTRAVENOUS

## 2023-03-01 MED ORDER — EPHEDRINE SULFATE-NACL 50-0.9 MG/10ML-% IV SOSY
PREFILLED_SYRINGE | INTRAVENOUS | Status: DC | PRN
  Administered 2023-03-01: 5 mg via INTRAVENOUS

## 2023-03-01 MED ORDER — NALOXONE HCL 4 MG/10ML IJ SOLN
1.0000 ug/kg/h | INTRAVENOUS | Status: DC | PRN
  Filled 2023-03-01: qty 5

## 2023-03-01 MED ORDER — ONDANSETRON HCL 4 MG/2ML IJ SOLN
INTRAMUSCULAR | Status: AC
  Filled 2023-03-01: qty 2

## 2023-03-01 MED ORDER — DEXAMETHASONE SODIUM PHOSPHATE 10 MG/ML IJ SOLN
INTRAMUSCULAR | Status: AC
  Filled 2023-03-01: qty 1

## 2023-03-01 MED ORDER — ONDANSETRON HCL 4 MG/2ML IJ SOLN
INTRAMUSCULAR | Status: DC | PRN
  Administered 2023-03-01: 4 mg via INTRAVENOUS

## 2023-03-01 MED ORDER — FENTANYL CITRATE (PF) 100 MCG/2ML IJ SOLN
INTRAMUSCULAR | Status: DC | PRN
  Administered 2023-03-01: 15 ug via INTRATHECAL

## 2023-03-01 MED ORDER — LACTATED RINGERS IV BOLUS
1000.0000 mL | Freq: Once | INTRAVENOUS | Status: AC
  Administered 2023-03-01: 1000 mL via INTRAVENOUS

## 2023-03-01 MED ORDER — OXYCODONE HCL 5 MG PO TABS
5.0000 mg | ORAL_TABLET | Freq: Once | ORAL | Status: AC | PRN
  Administered 2023-03-01: 5 mg via ORAL
  Filled 2023-03-01: qty 1

## 2023-03-01 MED ORDER — SODIUM CHLORIDE 0.9% FLUSH
INTRAVENOUS | Status: DC | PRN
  Administered 2023-03-01: 20 mL

## 2023-03-01 MED ORDER — MORPHINE SULFATE (PF) 0.5 MG/ML IJ SOLN
INTRAMUSCULAR | Status: DC | PRN
  Administered 2023-03-01: .1 mg via INTRATHECAL

## 2023-03-01 MED ORDER — ACETAMINOPHEN 500 MG PO TABS
1000.0000 mg | ORAL_TABLET | Freq: Four times a day (QID) | ORAL | Status: DC
  Administered 2023-03-02 – 2023-03-03 (×6): 1000 mg via ORAL
  Filled 2023-03-01 (×6): qty 2

## 2023-03-01 MED ORDER — COCONUT OIL OIL: 1.0000 | TOPICAL_OIL | Status: DC | PRN

## 2023-03-01 MED ORDER — BUPIVACAINE HCL (PF) 0.5 % IJ SOLN
INTRAMUSCULAR | Status: AC
  Filled 2023-03-01: qty 60

## 2023-03-01 MED ORDER — BISACODYL 10 MG RE SUPP: 10.0000 mg | Freq: Every day | RECTAL | Status: DC | PRN

## 2023-03-01 SURGICAL SUPPLY — 30 items
APL PRP STRL LF DISP 70% ISPRP (MISCELLANEOUS) ×1
BNDG TENSOPLAST 6X5 (GAUZE/BANDAGES/DRESSINGS) IMPLANT
CHLORAPREP W/TINT 26 (MISCELLANEOUS) ×1 IMPLANT
DRSG TELFA 3X8 NADH STRL (GAUZE/BANDAGES/DRESSINGS) ×1 IMPLANT
ELECT REM PT RETURN 9FT ADLT (ELECTROSURGICAL) ×1
ELECTRODE REM PT RTRN 9FT ADLT (ELECTROSURGICAL) ×1 IMPLANT
GAUZE SPONGE 4X4 12PLY STRL (GAUZE/BANDAGES/DRESSINGS) ×1 IMPLANT
GOWN STRL REUS W/ TWL LRG LVL3 (GOWN DISPOSABLE) ×3 IMPLANT
GOWN STRL REUS W/TWL LRG LVL3 (GOWN DISPOSABLE) ×3
MANIFOLD NEPTUNE II (INSTRUMENTS) ×1 IMPLANT
MAT PREVALON FULL STRYKER (MISCELLANEOUS) ×1 IMPLANT
NDL HYPO 25GX1X1/2 BEV (NEEDLE) ×1 IMPLANT
NEEDLE HYPO 25GX1X1/2 BEV (NEEDLE) ×1 IMPLANT
NS IRRIG 1000ML POUR BTL (IV SOLUTION) ×1 IMPLANT
PACK C SECTION AR (MISCELLANEOUS) ×1 IMPLANT
PAD OB MATERNITY 4.3X12.25 (PERSONAL CARE ITEMS) ×1 IMPLANT
PAD PREP OB/GYN DISP 24X41 (PERSONAL CARE ITEMS) ×1 IMPLANT
SCRUB CHG 4% DYNA-HEX 4OZ (MISCELLANEOUS) ×1 IMPLANT
SPONGE GAUZE 4X4 12PLY (GAUZE/BANDAGES/DRESSINGS) IMPLANT
SUT MNCRL 4-0 (SUTURE) ×1
SUT MNCRL 4-0 27XMFL (SUTURE) ×1
SUT VIC AB 0 CT1 36 (SUTURE) ×2 IMPLANT
SUT VIC AB 0 CTX 36 (SUTURE) ×2
SUT VIC AB 0 CTX36XBRD ANBCTRL (SUTURE) ×2 IMPLANT
SUT VIC AB 2-0 SH 27 (SUTURE) ×2
SUT VIC AB 2-0 SH 27XBRD (SUTURE) ×2 IMPLANT
SUTURE MNCRL 4-0 27XMF (SUTURE) ×1 IMPLANT
SYR 30ML LL (SYRINGE) ×2 IMPLANT
TRAP FLUID SMOKE EVACUATOR (MISCELLANEOUS) ×1 IMPLANT
WATER STERILE IRR 500ML POUR (IV SOLUTION) ×1 IMPLANT

## 2023-03-01 NOTE — Progress Notes (Signed)
[facility] [phone]

## 2023-03-01 NOTE — Progress Notes (Signed)
S: she states she is feeling well, denies any concerns.  O: 10ml additional blood in the drainage for the Bakri Uterus firm, midline, u-1, bleeding scant.  A: 3 hours after postpartum hemorrhage  P: removed additional 50ml from Bakri. Minimal blood loss since fluid removed. Uterus firm, bleeding scant.  - Monitor bleeding x 1 hour then remove Bakri if bleeding stable.  - Dr. Algis Downs. Schermerhorn updated on assessment.  Janyce Llanos, CNM 03/01/2023 6:39 PM

## 2023-03-01 NOTE — Op Note (Signed)
  Cesarean Section Procedure Note  Date of procedure: 03/01/2023   Pre-operative Diagnosis: Intrauterine pregnancy at [redacted]w[redacted]d;  - repeat cesarean section - active labor  Post-operative Diagnosis: same, delivered.  Procedure: Repeat Low Transverse Cesarean Section through Pfannenstiel incision  Surgeon: Christeen Douglas, MD  Assistant(s):  Donato Schultz, CNM   An experienced assistant was required given the standard of surgical care given the complexity of the case.  This assistant was needed for exposure, dissection, suctioning, retraction, instrument exchange,  CNM assisting with delivery with administration of fundal pressure, and for overall help during the procedure.   Anesthesia: Spinal anesthesia  Anesthesiologist: Yevette Edwards, MD Anesthesiologist: Yevette Edwards, MD CRNA: Hezzie Bump, CRNA; Karoline Caldwell, CRNA  Estimated Blood Loss:           Drains: Foley         Total IV Fluids:  Urine Output: 60ml         Specimens: cord blood for Rh neg mom         Complications:  None; patient tolerated the procedure well.         Disposition: PACU - hemodynamically stable.         Condition: stable  Findings:  A female infant "Genesis Yareli" in cephalic presentation. Amniotic fluid - Clear  Birth weight 3890 g.   APGAR (1 MIN): 8  APGAR (5 MINS): 9  APGAR (10 MINS):     Intact placenta with a three-vessel cord.  Grossly normal uterus, tubes and ovaries bilaterally. Moderate  intraabdominal adhesions were noted.  Indications: previous uterine incision    Procedure Details  The patient was taken to Operating Room, identified as the correct patient and the procedure verified as C-Section Delivery. A formal Time Out was held with all team members present and in agreement.  After induction of anesthesia, the patient was draped and prepped in the usual sterile manner. A Pfannenstiel skin incision was made and carried down through the subcutaneous  tissue to the fascia. Fascial incision was made and extended transversely with the Mayo scissors. The fascia was separated from the underlying rectus tissue superiorly and inferiorly. The peritoneum was identified and entered bluntly. Peritoneal incision was extended longitudinally. The utero-vesical peritoneal reflection was incised transversely and a bladder flap was created digitally.   A low transverse hysterotomy was made. The fetus was delivered atraumatically. The umbilical cord was clamped x2 and cut and the infant was handed to the awaiting pediatricians. The placenta was removed intact and appeared normal, intact, and with a 3-vessel cord.   The uterus was exteriorized and cleared of all clot and debris. The hysterotomy was closed with running sutures of 0-Vicryl. A second imbricating layer was placed with the same suture. Excellent hemostasis was observed. The peritoneal cavity was cleared of all clots and debris. The uterus was returned to the abdomen.   Gutters and pelvis were evaluated and excellent hemostasis was noted. The fascia was then reapproximated with running sutures of 0 Maxon.  The subcutaneous tissue was reapproximated with running sutures of 0 Vicryl. The skin was reapproximated with Ensorb absorbable staples. 30 of 0.5% bupivicaine was placed in the fascial and skin lines.  Instrument, sponge, and needle counts were correct prior to the abdominal closure and at the conclusion of the case.   The patient tolerated the procedure well and was transferred to the recovery room in stable condition.   Christeen Douglas, MD 03/01/2023

## 2023-03-01 NOTE — Progress Notes (Signed)
32 y.o. G2P1001 female at [redacted]w[redacted]d.  Patient had plan to do a trial of labor after cesarean, but on growth scan with the baby above 4000 g, she is requesting a repeat cesarean section.  The risks of cesarean section discussed with the patient included but were not limited to: bleeding which may require transfusion or reoperation; infection which may require antibiotics; injury to bowel, bladder, ureters or other surrounding organs; injury to the fetus; need for additional procedures including hysterectomy in the event of a life-threatening hemorrhage; placental abnormalities wth subsequent pregnancies, incisional problems, thromboembolic phenomenon and other postoperative/anesthesia complications. The patient concurred with the proposed plan, giving informed written consent for the procedure. Anesthesia and OR aware. Preoperative prophylactic antibiotics and SCDs ordered on call to the OR.  To OR when ready.

## 2023-03-01 NOTE — Discharge Summary (Signed)
Postpartum Discharge Summary  Patient Name: Emily Ruiz DOB: 1990-09-14 MRN: [id number]  Date of admission: 02/28/2023 Delivery date:03/01/2023 Delivering provider: Christeen Douglas Date of discharge: 03/03/2023  Primary OB: Phineas Real WUJ:[id number] last menstrual period was 05/25/2022. EDC Estimated Date of Delivery: 03/01/23 Gestational Age at Delivery: [redacted]w[redacted]d   Admitting diagnosis: Indication for care in labor or delivery [O75.9] Intrauterine pregnancy: [redacted]w[redacted]d     Secondary diagnosis:   Principal Problem:   S/P cesarean section Active Problems:   Indication for care in labor or delivery   Previous cesarean section   Obesity affecting pregnancy   Macrosomia   Discharge Diagnosis: Term Pregnancy Delivered, repeat cesarean section                                                Post partum procedures:blood transfusion Repeat cesarean section Complications: Hemorrhage>1021mL Delivery Type: repeat cesarean section, low transverse incision Anesthesia: spinal anesthesia Placenta: manual removal To Pathology: No  Episiotomy: none  Prenatal Labs:  Blood type/Rh O POS   Antibody screen Negative    Rubella Unknown   Varicella Unknown   RPR Negative   HBsAg Negative    Hep C Unknown   HIV Negative   GC neg  Chlamydia neg  Genetic screening Unknown   1 hour GTT Unknown   3 hour GTT Unknown   GBS Negative     Hospital course: Sceduled C/S   32 y.o. yo G2P2002 at [redacted]w[redacted]d was admitted to the hospital 02/28/2023 for cesarean section with the following indication:Elective Repeat.Delivery details are as follows:  Membrane Rupture Time/Date: 1:40 PM,03/01/2023  Delivery Method:C-Section, Low Transverse Operative Delivery:N/A Details of operation can be found in separate operative note.  Patient had a postpartum course complicated by hemorrhage.  She is ambulating, tolerating a regular diet, passing flatus, and urinating well. Patient is discharged home in stable  condition on  03/03/23        Newborn Data: Birth date:03/01/2023 Birth time:1:41 PM Gender:Female Living status:Living Apgars:8 ,9  Weight:3890 g    Magnesium Sulfate received: No BMZ received: No Rhophylac:No MMR: no records available, to be given by Phineas Real if she is not up to date Varivax vaccine given: no records available, to be given by Phineas Real if she is not up to date T-DaP: no records available, to be given by Phineas Real if she is not up to date Flu: no records available, to be given by Phineas Real if she is not up to date  Transfusion:Yes 2upRBC  Physical exam  Vitals:   03/02/23 1400 03/02/23 1420 03/02/23 1730 03/03/23 0026  BP:  123/67 120/62 109/73  Pulse: (!) 112 (!) 102 98 93  Resp:  16 16 18   Temp:  99 F (37.2 C) 98.8 F (37.1 C) 98 F (36.7 C)  TempSrc:   Oral Oral  SpO2: 98% 97%  100%  Weight:      Height:       General: alert, cooperative, and no distress Lochia: appropriate Uterine Fundus: firm Perineum:intact Incision: Healing well with no significant drainage, Dressing is clean, dry, and intact, covered with occlusive OP site dressing  DVT Evaluation: No evidence of DVT seen on physical exam.  Labs: Lab Results  Component Value Date   WBC 8.4 03/03/2023   HGB 9.9 (L) 03/03/2023   HCT 28.6 (L) 03/03/2023   MCV  86.1 03/03/2023   PLT 164 03/03/2023      Latest Ref Rng & Units 03/02/2023   12:32 PM  CMP  Glucose 70 - 99 mg/dL 716   BUN 6 - 20 mg/dL 8   Creatinine 9.67 - 8.93 mg/dL 8.10   Sodium 175 - 102 mmol/L 131   Potassium 3.5 - 5.1 mmol/L 3.6   Chloride 98 - 111 mmol/L 102   CO2 22 - 32 mmol/L 22   Calcium 8.9 - 10.3 mg/dL 8.3   Total Protein 6.5 - 8.1 g/dL 5.1   Total Bilirubin 0.3 - 1.2 mg/dL 0.6   Alkaline Phos 38 - 126 U/L 79   AST 15 - 41 U/L 20   ALT 0 - 44 U/L 11    Edinburgh Score:    03/02/2023   12:19 PM  Edinburgh Postnatal Depression Scale Screening Tool  I have been able to laugh and see the  funny side of things. 0  I have looked forward with enjoyment to things. 0  I have blamed myself unnecessarily when things went wrong. 1  I have been anxious or worried for no good reason. 0  I have felt scared or panicky for no good reason. 0  Things have been getting on top of me. 1  I have been so unhappy that I have had difficulty sleeping. 0  I have felt sad or miserable. 0  I have been so unhappy that I have been crying. 1  The thought of harming myself has occurred to me. 0  Edinburgh Postnatal Depression Scale Total 3    Risk assessment for postpartum VTE and prophylactic treatment: Very high risk factors: None High risk factors: BMI 40-50 kg/m2 Moderate risk factors: Cesarean delivery   Postpartum VTE prophylaxis with LMWH not indicated  After visit meds:  Allergies as of 03/03/2023   No Known Allergies      Medication List     TAKE these medications    acetaminophen 500 MG tablet Commonly known as: TYLENOL Take 2 tablets (1,000 mg total) by mouth every 6 (six) hours.   ferrous sulfate 325 (65 FE) MG tablet Take 1 tablet (325 mg total) by mouth every Monday, Wednesday, and Friday. Start taking on: March 04, 2023   ibuprofen 600 MG tablet Commonly known as: ADVIL Take 1 tablet (600 mg total) by mouth every 6 (six) hours.   multivitamin-prenatal 27-0.8 MG Tabs tablet Take 1 tablet by mouth daily at 12 noon.   oxyCODONE 5 MG immediate release tablet Commonly known as: Oxy IR/ROXICODONE Take 1-2 tablets (5-10 mg total) by mouth every 4 (four) hours as needed for moderate pain.   senna-docusate 8.6-50 MG tablet Commonly known as: Senokot-S Take 2 tablets by mouth daily.       Discharge home in stable condition Infant Feeding: Breast Infant Disposition:home with mother Discharge instruction: per After Visit Summary and Postpartum booklet. Activity: Advance as tolerated. Pelvic rest for 6 weeks.  Diet: routine diet Anticipated Birth Control:  Condoms Postpartum Appointment:6 weeks Additional Postpartum F/U: Incision check 2 weeks Future Appointments: Future Appointments  Date Time Provider Department Center  04/11/2023  2:30 PM [facility] VASC 3 [facility] None  04/11/2023  3:30 PM Schnier, Latina Craver, MD [facility] None   Follow up Visit:  Follow-up Information     Christeen Douglas, MD Follow up in 2 week(s).   Specialty: Obstetrics and Gynecology Why: For postop check; Contact information: [address] [place] [address] [phone]  Center, [facility] [employer]. Schedule an appointment as soon as possible for a visit in 6 week(s).   Specialty: General Practice Contact information: [address] [place] [address] [phone]                 Plan:  Emily Ruiz was discharged to home in good condition. Follow-up appointment as directed.    SignedCyril Mourning 03/03/2023 8:42 AM

## 2023-03-01 NOTE — Anesthesia Procedure Notes (Signed)
Spinal  Patient location during procedure: OR Start time: 03/01/2023 1:06 PM End time: 03/01/2023 1:10 PM Reason for block: surgical anesthesia Staffing Performed: resident/CRNA  Anesthesiologist: Yevette Edwards, MD Resident/CRNA: Hezzie Bump, CRNA Performed by: Hezzie Bump, CRNA Authorized by: Yevette Edwards, MD   Preanesthetic Checklist Completed: patient identified, IV checked, site marked, risks and benefits discussed, surgical consent, monitors and equipment checked, pre-op evaluation and timeout performed Spinal Block Patient position: sitting Prep: Betadine Patient monitoring: heart rate, continuous pulse ox, blood pressure and cardiac monitor Approach: midline Location: L4-5 Injection technique: single-shot Needle Needle type: Whitacre and Introducer  Needle gauge: 24 G Needle length: 9 cm Assessment Events: CSF return Additional Notes Negative paresthesia. Negative blood return. Positive free-flowing CSF. Expiration date of kit checked and confirmed. Patient tolerated procedure well, without complications.

## 2023-03-01 NOTE — Progress Notes (Addendum)
S: Called to the room by primary RN to evaluate patient's bleeding. Patient laying on heavily saturated chux pad. Through interpreter, patient reports she is feeling fatigued and weak. Interpreter available.  O: Total blood loss: ( from delivery, from postpartum hemorrhage) Vitals:   03/01/23 1520 03/01/23 1535  BP: (!) 90/55 115/67  Pulse:    Resp: 15 15  Temp:     1534: BP 115/67, HR 73  Patient is pale, A&Ox4.   Fundus firm, oozing present with fundal rub. Manual sweep performed and cervix noted to be 4cm dilated and blood clots present in lower uterine segment. Blood clots removed with ease and bleeding slowed.   A: postpartum hemorrhage  P:  - Prior to losing IV, ordered and gave 1g TXA. Methergine 0.2mg  IM.  - Manual sweep x3 with sterile gloves removed several handfuls of blood clots.  - Dr. Dalbert Garnet called and notified. Mel Almond and Bakri available for Dr. Dalbert Garnet per her preference. She is coming to evaluate patient.  - Need IV access x2 (Rns unable to get IV access, IV team called STAT) - Emergent blood administration of 2u PRBCs - STAT CBC and fibrinogen - 2g Ancef for postpartum endometritis prevention  Janyce Llanos, CNM 03/01/2023 3:49 PM

## 2023-03-01 NOTE — Progress Notes (Signed)
S: she is feeling well, denies any concerns.  O: Bakri is removed Uterus firm, midline, u-1, bleeding scant.  2nd unit PRBCs infusing.  Vitals:   03/01/23 2015 03/01/23 2031  BP: (!) 144/95   Pulse:  95  Resp:  18  Temp: 98.9 F (37.2 C)   SpO2:     A: 5 hours after postpartum hemorrhage  P: Repeat CBC 2 hours after completing 2nd u PRBCs  - Can eat regular diet now. - Dr. Algis Downs Schermerhorn updated on assessment.  Janyce Llanos CNM 03/01/2023 8:51 PM

## 2023-03-01 NOTE — Anesthesia Preprocedure Evaluation (Signed)
Anesthesia Evaluation  Patient identified by MRN, date of birth, ID band Patient awake    Reviewed: Allergy & Precautions, H&P , NPO status , Patient's Chart, lab work & pertinent test results, reviewed documented beta blocker date and time   Airway Mallampati: II  TM Distance: >3 FB Neck ROM: full    Dental no notable dental hx. (+) Teeth Intact   Pulmonary neg pulmonary ROS   Pulmonary exam normal breath sounds clear to auscultation       Cardiovascular Exercise Tolerance: Good negative cardio ROS  Rhythm:regular Rate:Normal     Neuro/Psych negative neurological ROS  negative psych ROS   GI/Hepatic Neg liver ROS,GERD  Medicated,,  Endo/Other  diabetes, Gestational  Morbid obesity  Renal/GU negative Renal ROS  negative genitourinary   Musculoskeletal   Abdominal   Peds  Hematology negative hematology ROS (+)   Anesthesia Other Findings   Reproductive/Obstetrics (+) Pregnancy                             Anesthesia Physical Anesthesia Plan  ASA: 3  Anesthesia Plan: Spinal   Post-op Pain Management:    Induction:   PONV Risk Score and Plan:   Airway Management Planned:   Additional Equipment:   Intra-op Plan:   Post-operative Plan:   Informed Consent: I have reviewed the patients History and Physical, chart, labs and discussed the procedure including the risks, benefits and alternatives for the proposed anesthesia with the patient or authorized representative who has indicated his/her understanding and acceptance.       Plan Discussed with:   Anesthesia Plan Comments:        Anesthesia Quick Evaluation

## 2023-03-01 NOTE — Progress Notes (Signed)
AMN Int [id number]

## 2023-03-01 NOTE — Transfer of Care (Signed)
Immediate Anesthesia Transfer of Care Note  Patient: Emily Ruiz  Procedure(s) Performed: CESAREAN SECTION  Patient Location: PACU  Anesthesia Type:Spinal  Level of Consciousness: awake, alert , and oriented  Airway & Oxygen Therapy: Patient Spontanous Breathing  Post-op Assessment: Report given to RN and Post -op Vital signs reviewed and stable  Post vital signs: Reviewed and stable  Last Vitals:  Vitals Value Taken Time  BP    Temp    Pulse    Resp    SpO2      Last Pain:  Vitals:   03/01/23 0745  TempSrc: Oral  PainSc: 4       Patients Stated Pain Goal: 0 (03/01/23 0745)  Complications: No notable events documented.

## 2023-03-01 NOTE — Progress Notes (Addendum)
To patient room by CNM for heavy bleeding after a repeat cesarean section.  Has lost almost 3L total with the hemorrhage during her surgery. She reports hemorrhage and needing blood with her first cesarean delivery   Through iPad Spanish interpreter, patient reports she is feeling dizzy, no SOB. She has recevied TXA and methergine IM. IV team at the bedside. pRBCs present at the bedside.  O: Total blood loss: ( from delivery, from postpartum hemorrhage) Vitals:   03/01/23 1608 03/01/23 1615  BP:  128/81  Pulse:  72  Resp: 18 20  Temp: 97.7 F (36.5 C)   SpO2:  100%   1534: BP 115/67, HR 73 1608: 132/82, HR 82  Patient is pale, A&Ox4.   Fundus firm, cervix open to 3cm. Minimal clots extracted, no retained products palpable. Bakri placed aseptically in standard fashion without complication.  A: Stage 2 primary postpartum hemorrhage, s/p uterotonic and tranexamic acid.  P:  - Bakri balloon placed with placed. Jada not used as no atony noted. The surgical hemorrhage was due to bleeding and oozing vessels at the hysterotomy and along the posterior uterine wall on adhesive blebs, and several vessels in the subcutaneous tissue, but not at the rectus. This was examined closely due to her prior hx of hemorrhage. - Will leave in for 1 hr. I am hoping compression will assist. Mild pressure appropriate at the hysterotomy. Will remove in 1 hr from placement, as bleeding is resolving with clot evacuation and hemorrhage meds - 2u pRBC ordered - Coagulopathy panel ordered - Pt questions answered. She is calm and resting. Partner and baby at the bedside. - 2g Ancef for endometritis prevention  Christeen Douglas, MD 03/01/2023 4:42 PM  2 hours 17 minutes spent at the bedside, coordinating care and monitoring life-threatening situation.

## 2023-03-01 NOTE — Progress Notes (Signed)
S: she states she is feeling less tired and cold, is feeling better overall.  O: 35ml blood in the drainage for the Bakri Uterus firm, midline, u-1. Bleeding scant. Vitals:   03/01/23 1630 03/01/23 1645  BP: (!) 141/85 (!) 143/83  Pulse: 76 80  Resp: 15 17  Temp:    SpO2: 100% 98%   Recent Results (from the past 2160 hour(s))  OB RESULT CONSOLE Group B Strep     Status: None   Collection Time: 02/06/23 12:00 AM  Result Value Ref Range   GBS Negative   CBC     Status: Abnormal   Collection Time: 02/28/23 11:19 PM  Result Value Ref Range   WBC 7.8 4.0 - 10.5 K/uL   RBC 4.05 3.87 - 5.11 MIL/uL   Hemoglobin 11.9 (L) 12.0 - 15.0 g/dL   HCT 16.1 (L) 09.6 - 04.5 %   MCV 87.2 80.0 - 100.0 fL   MCH 29.4 26.0 - 34.0 pg   MCHC 33.7 30.0 - 36.0 g/dL   RDW 40.9 81.1 - 91.4 %   Platelets 207 150 - 400 K/uL   nRBC 0.0 0.0 - 0.2 %    Comment: Performed at [facility], [address] [place], [place] [address]  Type and screen [facility]     Status: None (Preliminary result)   Collection Time: 02/28/23 11:19 PM  Result Value Ref Range   ABO/RH(D) O POS    Antibody Screen NEG    Sample Expiration 03/03/2023,2359    Unit Number [id number]    Blood Component Type RED CELLS,LR    Unit division 00    Status of Unit ISSUED    Transfusion Status OK TO TRANSFUSE    Crossmatch Result      Compatible Performed at [facility], [address] [place], [place] [address]   RPR     Status: None   Collection Time: 02/28/23 11:19 PM  Result Value Ref Range   RPR Ser Ql NON REACTIVE NON REACTIVE    Comment: Performed at [facility], [address] [place], [place] [address]  Rapid HIV screen (HIV 1/2 Ab+Ag)     Status: None   Collection Time: 02/28/23 11:19 PM  Result Value Ref Range   HIV-1 P24 Antigen - HIV24 NON REACTIVE NON REACTIVE    Comment: (NOTE) Detection of p24 may be inhibited by biotin in the sample, causing false negative  results in acute infection.    HIV 1/2 Antibodies NON REACTIVE NON REACTIVE   Interpretation (HIV Ag Ab)      A non reactive test result means that HIV 1 or HIV 2 antibodies and HIV 1 p24 antigen were not detected in the specimen.    Comment: Performed at [facility], [address] [place], [place] [address]  BPAM RBC     Status: None (Preliminary result)   Collection Time: 02/28/23 11:19 PM  Result Value Ref Range   ISSUE DATE / TIME [id number]    Blood Product Unit Number [id number]    PRODUCT CODE E0382V00    Unit Type and Rh 5100    Blood Product Expiration Date [id number]   CBC     Status: Abnormal   Collection Time: 03/01/23  3:15 PM  Result Value Ref Range   WBC 10.8 (H) 4.0 - 10.5 K/uL   RBC 3.85 (L) 3.87 - 5.11 MIL/uL   Hemoglobin 11.5 (L) 12.0 - 15.0 g/dL   HCT 06.3 (  L) 36.0 - 46.0 %   MCV 89.1 80.0 - 100.0 fL   MCH 29.9 26.0 - 34.0 pg   MCHC 33.5 30.0 - 36.0 g/dL   RDW 69.6 29.5 - 28.4 %   Platelets 195 150 - 400 K/uL   nRBC 0.0 0.0 - 0.2 %    Comment: Performed at [facility], [address] [place], [place] [address]  Prepare RBC     Status: None   Collection Time: 03/01/23  3:27 PM  Result Value Ref Range   Order Confirmation      ORDER PROCESSED BY BLOOD BANK Performed at [facility], [address] [place], [place] [address]   CBC     Status: Abnormal   Collection Time: 03/01/23  4:27 PM  Result Value Ref Range   WBC 14.9 (H) 4.0 - 10.5 K/uL   RBC 3.82 (L) 3.87 - 5.11 MIL/uL   Hemoglobin 11.5 (L) 12.0 - 15.0 g/dL   HCT 25.3 (L) 66.4 - 40.3 %   MCV 88.5 80.0 - 100.0 fL   MCH 30.1 26.0 - 34.0 pg   MCHC 34.0 30.0 - 36.0 g/dL   RDW 47.4 25.9 - 56.3 %   Platelets 205 150 - 400 K/uL   nRBC 0.0 0.0 - 0.2 %    Comment: Performed at [facility], [address] [place], [place] [address]  Fibrinogen (coagulopathy lab panel)     Status: None   Collection Time: 03/01/23  4:27 PM  Result Value Ref Range    Fibrinogen 391 210 - 475 mg/dL    Comment: (NOTE) Fibrinogen results may be underestimated in patients receiving thrombolytic therapy. Performed at [facility], [address] [place], [place] [address]   Protime-INR (coagulopathy lab panel)     Status: None   Collection Time: 03/01/23  4:27 PM  Result Value Ref Range   Prothrombin Time 14.7 11.4 - 15.2 seconds   INR 1.1 0.8 - 1.2    Comment: (NOTE) INR goal varies based on device and disease states. Performed at [facility], [address] [place], [place] [address]   APTT (coagulopathy lab panel)     Status: None   Collection Time: 03/01/23  4:27 PM  Result Value Ref Range   aPTT 29 24 - 36 seconds    Comment: Performed at [facility], [address] [place], [place] [address]   A: 2 hours after postpartum hemorrhage  P: - Per Dr. Dalbert Garnet, 50ml fluid removed from Bakri. Uterus remained firm, bleeding scant.  - Will monitor bleeding x 1 hour then remove another 50ml fluid from Bakri. After another hour of bleeding being stable, will finish removing fluid from Bakri and remove Bakri. - Dr. Algis Downs. Schermerhorn at bedside assessing patient as well and aware of plan of care. - NPO with sips of fluids at this time.  Janyce Llanos, CNM 03/01/2023 5:39 PM

## 2023-03-01 NOTE — Progress Notes (Signed)
Pt breastfeeding during BP check.

## 2023-03-02 LAB — CBC
HCT: 30.5 % — ABNORMAL LOW (ref 36.0–46.0)
HCT: 31.9 % — ABNORMAL LOW (ref 36.0–46.0)
Hemoglobin: 10.4 g/dL — ABNORMAL LOW (ref 12.0–15.0)
Hemoglobin: 11 g/dL — ABNORMAL LOW (ref 12.0–15.0)
MCH: 29.2 pg (ref 26.0–34.0)
MCH: 29.7 pg (ref 26.0–34.0)
MCHC: 34.1 g/dL (ref 30.0–36.0)
MCHC: 34.5 g/dL (ref 30.0–36.0)
MCV: 85.7 fL (ref 80.0–100.0)
MCV: 86.2 fL (ref 80.0–100.0)
Platelets: 174 10*3/uL (ref 150–400)
Platelets: 175 10*3/uL (ref 150–400)
RBC: 3.56 MIL/uL — ABNORMAL LOW (ref 3.87–5.11)
RBC: 3.7 MIL/uL — ABNORMAL LOW (ref 3.87–5.11)
RDW: 14.1 % (ref 11.5–15.5)
RDW: 14.3 % (ref 11.5–15.5)
WBC: 10 10*3/uL (ref 4.0–10.5)
WBC: 12.3 10*3/uL — ABNORMAL HIGH (ref 4.0–10.5)
nRBC: 0 % (ref 0.0–0.2)
nRBC: 0 % (ref 0.0–0.2)

## 2023-03-02 LAB — COMPREHENSIVE METABOLIC PANEL
ALT: 11 U/L (ref 0–44)
AST: 20 U/L (ref 15–41)
Albumin: 2.3 g/dL — ABNORMAL LOW (ref 3.5–5.0)
Alkaline Phosphatase: 79 U/L (ref 38–126)
Anion gap: 7 (ref 5–15)
BUN: 8 mg/dL (ref 6–20)
CO2: 22 mmol/L (ref 22–32)
Calcium: 8.3 mg/dL — ABNORMAL LOW (ref 8.9–10.3)
Chloride: 102 mmol/L (ref 98–111)
Creatinine, Ser: 0.43 mg/dL — ABNORMAL LOW (ref 0.44–1.00)
GFR, Estimated: >60 mL/min (ref >60)
Glucose, Bld: 131 mg/dL — ABNORMAL HIGH (ref 70–99)
Potassium: 3.6 mmol/L (ref 3.5–5.1)
Sodium: 131 mmol/L — ABNORMAL LOW (ref 135–145)
Total Bilirubin: 0.6 mg/dL (ref 0.3–1.2)
Total Protein: 5.1 g/dL — ABNORMAL LOW (ref 6.5–8.1)

## 2023-03-02 LAB — TYPE AND SCREEN
ABO/RH(D): O POS
Antibody Screen: NEGATIVE
Unit division: 0
Unit division: 0

## 2023-03-02 LAB — BPAM RBC
Blood Product Expiration Date: 202411122359
Blood Product Expiration Date: 202411132359
ISSUE DATE / TIME: 202410111539
ISSUE DATE / TIME: 202410112011
Unit Type and Rh: 5100
Unit Type and Rh: 5100

## 2023-03-02 LAB — PROTEIN / CREATININE RATIO, URINE
Creatinine, Urine: 108 mg/dL
Protein Creatinine Ratio: 0.19 mg/mg{creat} — ABNORMAL HIGH (ref 0.00–0.15)
Total Protein, Urine: 21 mg/dL

## 2023-03-02 LAB — PREPARE RBC (CROSSMATCH)

## 2023-03-02 NOTE — Anesthesia Postprocedure Evaluation (Signed)
Anesthesia Post Note  Patient: Emily Ruiz  Procedure(s) Performed: CESAREAN SECTION  Patient location during evaluation: Mother Baby Anesthesia Type: Spinal Level of consciousness: oriented and awake and alert Pain management: pain level controlled Vital Signs Assessment: post-procedure vital signs reviewed and stable Respiratory status: spontaneous breathing and respiratory function stable Cardiovascular status: blood pressure returned to baseline and stable Postop Assessment: no headache, no backache, no apparent nausea or vomiting and able to ambulate Anesthetic complications: no   No notable events documented.   Last Vitals:  Vitals:   03/02/23 0328 03/02/23 0957  BP: 118/71 131/82  Pulse:  83  Resp: 16 16  Temp: 36.6 C 36.7 C  SpO2: 96% 100%    Last Pain:  Vitals:   03/02/23 0957  TempSrc: Oral  PainSc:                  Lenard Simmer

## 2023-03-02 NOTE — Progress Notes (Signed)
Post Partum Day 1  Subjective: Doing well, no concerns. Ambulating without difficulty, pain managed with PO meds, tolerating regular diet, and voiding without difficulty.   No fever/chills, chest pain, shortness of breath, nausea/vomiting, or leg pain. No nipple or breast pain.   Objective: BP 118/71   Pulse 89   Temp 97.9 F (36.6 C) (Oral)   Resp 16   Ht 5\' 3"  (1.6 m)   Wt 109.3 kg   LMP 05/25/2022   SpO2 96%   Breastfeeding Yes   BMI 42.69 kg/m    Physical Exam:  General: alert and cooperative Breasts: soft/nontender CV: RRR Pulm: nl effort Abdomen: soft, non-tender Uterine Fundus: firm Incision: pressure dressing in place, no drainage noted Perineum: n/a Lochia: appropriate DVT Evaluation: No evidence of DVT seen on physical exam. Edinburgh:      No data to display           Recent Labs    03/01/23 1627 03/02/23 0012  HGB 11.5* 11.0*  HCT 33.8* 31.9*  WBC 14.9* 12.3*  PLT 205 175    Assessment/Plan: 32 y.o. G2P2002 postpartum day # 1  1. Continue routine postpartum care  2. Infant feeding status: breast feeding -Lactation consult PRN for breastfeeding   3. Contraception plan: condoms  4. Acute blood loss anemia - clinically significant.  -Hemodynamically stable and asymptomatic - has not yet been up out of bed today, foley will be removed today -Intervention:  Methergine, TXA, Pitocin, Bakri balloon, 2u pRBCs  5. Postpartum hemorrhage - CBC at 0000: WBC down to 12.3; hgb down to 11.0 - CMP at 0722 pending - Plan to repeat CBC and CMP at 1200 - I&Os 1901 - 0700 = 70mL/hr  6. Elevated BPs - BPs since 0000: 133/73, 118/71, 131/82 - PCR requested by Dr. Algis Downs. Schermerhorn - Will continue to monitor  7. Immunization status:   {postpartum immunization:25357}    Disposition: Continue inpatient postpartum care ***Desires discharge home today   LOS: 2 days   Shevy Yaney, CNM 03/02/2023, 9:54 AM

## 2023-03-02 NOTE — Lactation Note (Signed)
This note was copied from a baby's chart. Lactation Consultation Note  Patient Name: Emily Ruiz [id number] Date: 03/02/2023 Age:33 hours Reason for consult: Follow-up assessment;Term [place], spanish interpreter present   Maternal Data Has patient been taught Hand Expression?: Yes Does the patient have breastfeeding experience prior to this delivery?: Yes How long did the patient breastfeed?: 2 yrs PPH post repeat C/S Feeding Mother's Current Feeding Choice: Breast Milk and Formula Nipple Type: Slow - flow I have been assessing baby and mom and breastfeeding x3 today, baby has fed at breast for short periods ( I did not see these)and after each feeding a family member would feed formula with last formula feeding baby took 35 cc.  With spanish interpreter present, I informed the mom that she is at risk of having low milk supply and delayed milk production because of the [facility] and that her breasts need to be stimulated frequently to tell her body to make milk.  I informed her that she could pump her breasts after breastfeeding to stimulate her production more, but she prefers frequent breastfeeding only.  I assisted her with latching baby to left breast in cradle hold, after hand expression baby latched easily with positioning assist, baby was sleepy after first few min of nursing but mom encouraged to keep stimulating baby to nurse and encouraged her to try to get baby to nurse at least 15 min on one side or longer, After 15 min baby came off breast, I encouraged mom to offer right breast after burping baby and stimulating baby to wake, baby latched easily to right after hand expression in cradle hold and nursed 20 min.  I heard some audible swallows but observed more swallows.  I informed mom that if she felt she must formula feed after nursing to limit amts to no more than 15 cc at present.               LATCH Score Latch: Grasps breast easily, tongue down, lips flanged, rhythmical  sucking.  Audible Swallowing: A few with stimulation  Type of Nipple: Everted at rest and after stimulation  Comfort (Breast/Nipple): Soft / non-tender  Hold (Positioning): Assistance needed to correctly position infant at breast and maintain latch.  LATCH Score: 8   Lactation Tools Discussed/Used   LC name and no written on white board Interventions Interventions: Breast feeding basics reviewed;Assisted with latch;Skin to skin;Hand express;Adjust position;Support pillows;Hand pump;Education  Discharge Pump: Manual;Advised to call insurance company (given [employer] breast pump) WIC Program: No  Consult Status Consult Status: Follow-up Date: 03/03/23 Follow-up type: In-patient    Dyann Kief 03/02/2023, 4:12 PM

## 2023-03-02 NOTE — Anesthesia Post-op Follow-up Note (Signed)
  Anesthesia Pain Follow-up Note  Patient: Emily Ruiz  Day #: 1  Date of Follow-up: 03/02/2023 Time: 11:24 AM  Last Vitals:  Vitals:   03/02/23 0328 03/02/23 0957  BP: 118/71 131/82  Pulse:  83  Resp: 16 16  Temp: 36.6 C 36.7 C  SpO2: 96% 100%    Level of Consciousness: alert  Pain: mild   Side Effects:Pruritis  Catheter Site Exam:clean, dry     Plan: D/C from anesthesia care at surgeon's request  Lenard Simmer

## 2023-03-03 DIAGNOSIS — Z98891 History of uterine scar from previous surgery: Principal | ICD-10-CM

## 2023-03-03 LAB — CBC
HCT: 28.6 % — ABNORMAL LOW (ref 36.0–46.0)
Hemoglobin: 9.9 g/dL — ABNORMAL LOW (ref 12.0–15.0)
MCH: 29.8 pg (ref 26.0–34.0)
MCHC: 34.6 g/dL (ref 30.0–36.0)
MCV: 86.1 fL (ref 80.0–100.0)
Platelets: 164 10*3/uL (ref 150–400)
RBC: 3.32 MIL/uL — ABNORMAL LOW (ref 3.87–5.11)
RDW: 14.7 % (ref 11.5–15.5)
WBC: 8.4 10*3/uL (ref 4.0–10.5)
nRBC: 0 % (ref 0.0–0.2)

## 2023-03-03 MED ORDER — ACETAMINOPHEN 500 MG PO TABS: 1000.0000 mg | ORAL_TABLET | Freq: Four times a day (QID) | ORAL | 0 refills | Status: AC

## 2023-03-03 MED ORDER — FERROUS SULFATE 325 (65 FE) MG PO TABS: 325.0000 mg | ORAL_TABLET | ORAL | Status: AC

## 2023-03-03 MED ORDER — FERROUS SULFATE 325 (65 FE) MG PO TABS: 325.0000 mg | ORAL_TABLET | ORAL | Status: DC

## 2023-03-03 MED ORDER — SENNOSIDES-DOCUSATE SODIUM 8.6-50 MG PO TABS: 2.0000 | ORAL_TABLET | ORAL | Status: AC

## 2023-03-03 MED ORDER — IBUPROFEN 600 MG PO TABS: 600.0000 mg | ORAL_TABLET | Freq: Four times a day (QID) | ORAL | 0 refills | Status: AC

## 2023-03-03 MED ORDER — OXYCODONE HCL 5 MG PO TABS: 5.0000 mg | ORAL_TABLET | ORAL | 0 refills | Status: AC | PRN

## 2023-03-03 NOTE — Lactation Note (Signed)
This note was copied from a baby's chart. Lactation Consultation Note  Patient Name: Emily Ruiz [id number] Date: 03/03/2023 Age:33 hours Reason for consult: Follow-up assessment;Term  Spanish interpreter used for this visit. [name] [id number]  Maternal Data Lactation to room for a follow up assessment/discharge education w/ 44hr old baby Emily.  Mom stated that she is putting infant to the breast first and then following up with formula.  Mom stated that her breast were sore where infant is sucking .    Feeding Mother's Current Feeding Choice: Breast Milk and Formula  Interventions Interventions: Breast feeding basics reviewed;Education;CDC Guidelines for Breast Pump Cleaning  Discharge Discharge Education: Engorgement and breast care;Warning signs for feeding baby Pump: Advised to call insurance company  Education on engorgement prevention/treatment was discussed as well as breastmilk storage guidelines.  LC provided patient with a handout on breastmilk storage guidelines from [facility]. Patient verbalized understanding   LC encouraged mom to call her insurance company about possibly receiving an electric breastpump.   Consult Status Consult Status: Complete Follow-up type: Call as needed    [name] 03/03/2023, 10:11 AM

## 2023-03-03 NOTE — Progress Notes (Signed)
Patient discharged home with family.  Discharge instructions, when to follow up, and prescriptions reviewed with patient.  Patient verbalized understanding. Patient will be escorted out by auxiliary.

## 2023-03-04 ENCOUNTER — Encounter: Payer: Self-pay | Admitting: Obstetrics and Gynecology

## 2023-04-05 ENCOUNTER — Other Ambulatory Visit (INDEPENDENT_AMBULATORY_CARE_PROVIDER_SITE_OTHER): Payer: Self-pay | Admitting: Nurse Practitioner

## 2023-04-05 DIAGNOSIS — I83893 Varicose veins of bilateral lower extremities with other complications: Secondary | ICD-10-CM

## 2023-04-07 ENCOUNTER — Emergency Department: Payer: 59

## 2023-04-07 ENCOUNTER — Emergency Department
Admission: EM | Admit: 2023-04-07 | Discharge: 2023-04-08 | Disposition: A | Discharge status: Home / Self Care | Payer: 59 | Attending: Emergency Medicine | Admitting: Emergency Medicine

## 2023-04-07 ENCOUNTER — Other Ambulatory Visit: Payer: Self-pay

## 2023-04-07 DIAGNOSIS — N939 Abnormal uterine and vaginal bleeding, unspecified: Secondary | ICD-10-CM

## 2023-04-07 DIAGNOSIS — N938 Other specified abnormal uterine and vaginal bleeding: Secondary | ICD-10-CM | POA: Insufficient documentation

## 2023-04-07 DIAGNOSIS — R1030 Lower abdominal pain, unspecified: Secondary | ICD-10-CM | POA: Diagnosis not present

## 2023-04-07 LAB — CBC WITH DIFFERENTIAL/PLATELET
Abs Immature Granulocytes: 0.03 10*3/uL (ref 0.00–0.07)
Basophils Absolute: 0 10*3/uL (ref 0.0–0.1)
Basophils Relative: 0 %
Eosinophils Absolute: 0.2 10*3/uL (ref 0.0–0.5)
Eosinophils Relative: 2 %
HCT: 37 % (ref 36.0–46.0)
Hemoglobin: 12.4 g/dL (ref 12.0–15.0)
Immature Granulocytes: 0 %
Lymphocytes Relative: 37 %
Lymphs Abs: 2.8 10*3/uL (ref 0.7–4.0)
MCH: 29.8 pg (ref 26.0–34.0)
MCHC: 33.5 g/dL (ref 30.0–36.0)
MCV: 88.9 fL (ref 80.0–100.0)
Monocytes Absolute: 0.5 10*3/uL (ref 0.1–1.0)
Monocytes Relative: 7 %
Neutro Abs: 4.1 10*3/uL (ref 1.7–7.7)
Neutrophils Relative %: 54 %
Platelets: 273 10*3/uL (ref 150–400)
RBC: 4.16 MIL/uL (ref 3.87–5.11)
RDW: 12.5 % (ref 11.5–15.5)
WBC: 7.6 10*3/uL (ref 4.0–10.5)
nRBC: 0 % (ref 0.0–0.2)

## 2023-04-07 NOTE — ED Triage Notes (Signed)
Pt reports heavy vaginal bleeding with clots since yesterday. Pt reports mild bleeding since c-section on 10/11 but increased in bleeding since yesterday. Pt reports feeling weak and having cramping abdominal pain. Pt reports after delivery she had to have a blood transfusion.

## 2023-04-08 MED ORDER — ACETAMINOPHEN 325 MG PO TABS
650.0000 mg | ORAL_TABLET | Freq: Once | ORAL | Status: AC
  Administered 2023-04-08: 650 mg via ORAL

## 2023-04-08 NOTE — Discharge Instructions (Signed)
Your testing in the emergency department did not show any emergency conditions like life-threatening blood loss, or infection in the uterus.  You should follow-up with Dr. Dalbert Garnet in the morning, as they can check for other conditions that account for your symptoms.  Thank you for choosing [place] for your health care today!  Please see your primary doctor this week for a follow up appointment.   If you have any new, worsening, or unexpected symptoms call your doctor right away or come back to the emergency department for reevaluation.  It was my pleasure to care for you today.   Daneil Dan Modesto Charon, MD   -- Sus pruebas en el departamento de emergencias no mostraron ninguna condicin de Associate Professor, como prdida de sangre que ponga en peligro su vida o infeccin en el tero.  Debe realizar un seguimiento con el Dr. Dalbert Garnet por la maana, ya que l puede verificar si hay otras afecciones que expliquen sus sntomas.  Gracias por elegirnos para el cuidado de su salud hoy!  Consulte a su mdico de atencin primaria esta semana para una cita de seguimiento.   Si tiene algn sntoma nuevo, que empeora o inesperado, llame a su mdico de inmediato o regrese al departamento de emergencias para una reevaluacin.  Fue un placer cuidar de usted hoy.   Daneil Dan Modesto Charon, MD

## 2023-04-08 NOTE — ED Provider Notes (Signed)
[facility] Provider Note    Event Date/Time   First MD Initiated Contact with Patient 04/07/23 2308     (approximate)   History   Vaginal Bleeding   HPI  Emily Ruiz is a 32 y.o. female   Past medical history of C-section 1 month ago presents to the emergency department with new vaginal bleeding over the last couple days with lower abdominal cramping.  Denies fever, discharge, urinary symptoms.  No other acute medical complaints  History was obtained via Spanish interpreter    External Medical Documents Reviewed: Dr. Francena Hanly postop check on 03/14/2023 was unremarkable      Physical Exam   Triage Vital Signs: ED Triage Vitals  Encounter Vitals Group     BP 04/07/23 2138 138/88     Systolic BP Percentile --      Diastolic BP Percentile --      Pulse Rate 04/07/23 2138 78     Resp 04/07/23 2138 16     Temp 04/07/23 2139 98.5 F (36.9 C)     Temp src --      SpO2 04/07/23 2138 100 %     Weight --      Height --      Head Circumference --      Peak Flow --      Pain Score 04/07/23 2135 8     Pain Loc --      Pain Education --      Exclude from Growth Chart --     Most recent vital signs: Vitals:   04/07/23 2138 04/07/23 2139  BP: 138/88   Pulse: 78   Resp: 16   Temp:  98.5 F (36.9 C)  SpO2: 100%     General: Awake, no distress.  CV:  Good peripheral perfusion.  Resp:  Normal effort.  Abd:  No distention.  Other:  Comfortable appearing no acute distress soft nontender abdominal exam deep palpation all quadrants, speculum exam positive for scant blood in the vaginal vault   ED Results / Procedures / Treatments   Labs (all labs ordered are listed, but only abnormal results are displayed) Labs Reviewed  CBC WITH DIFFERENTIAL/PLATELET  TYPE AND SCREEN     I ordered and reviewed the above labs they are notable for H&H within normal limit   RADIOLOGY I independently reviewed and interpreted pelvic  ultrasound and see no obvious masses in the uterus I also reviewed radiologist's formal read.   PROCEDURES:  Critical Care performed: No  Procedures   MEDICATIONS ORDERED IN ED: Medications  acetaminophen (TYLENOL) tablet 650 mg (650 mg Oral Given 04/08/23 0110)     IMPRESSION / MDM / ASSESSMENT AND PLAN / ED COURSE  I reviewed the triage vital signs and the nursing notes.                                Patient's presentation is most consistent with acute presentation with potential threat to life or bodily function.  Differential diagnosis includes, but is not limited to, acute blood loss anemia, fibroid uterus, retained products, infection   The patient is on the cardiac monitor to evaluate for evidence of arrhythmia and/or significant heart rate changes.  MDM:    Abnormal uterine bleeding in this patient status post C-section with normal-appearing scar benign abdominal exam and an ultrasound with no pathologic findings.  She does have some scant blood in  the vaginal canal.  There is no discharge, she appears nontoxic, normal white blood cell count I doubt infection at this time.  No signs of acute blood loss anemia in this patient with stable vital signs and normal H&H.  She will follow-up with Dr. Dalbert Garnet in the morning       FINAL CLINICAL IMPRESSION(S) / ED DIAGNOSES   Final diagnoses:  Vaginal bleeding     Rx / DC Orders   ED Discharge Orders     None        Note:  This document was prepared using Dragon voice recognition software and may include unintentional dictation errors.    Pilar Jarvis, MD 04/08/23 [phone]

## 2023-04-08 NOTE — ED Notes (Signed)
Writer RN chaperone Dr Modesto Charon for pelvic exam pt tolerated well interpreter remains in room with screen wall facing

## 2023-04-09 ENCOUNTER — Encounter: Payer: Self-pay | Admitting: Obstetrics and Gynecology

## 2023-04-09 ENCOUNTER — Other Ambulatory Visit: Payer: Self-pay

## 2023-04-09 ENCOUNTER — Ambulatory Visit
Admission: AD | Admit: 2023-04-09 | Discharge: 2023-04-09 | Disposition: A | Discharge status: Home / Self Care | Payer: 59 | Source: Ambulatory Visit | Attending: Obstetrics and Gynecology | Admitting: Obstetrics and Gynecology

## 2023-04-09 ENCOUNTER — Ambulatory Visit: Payer: 59 | Admitting: Anesthesiology

## 2023-04-09 ENCOUNTER — Encounter
Admission: AD | Disposition: A | Discharge status: Home / Self Care | Payer: Self-pay | Source: Ambulatory Visit | Attending: Obstetrics and Gynecology

## 2023-04-09 DIAGNOSIS — Z98891 History of uterine scar from previous surgery: Secondary | ICD-10-CM | POA: Diagnosis not present

## 2023-04-09 DIAGNOSIS — D649 Anemia, unspecified: Secondary | ICD-10-CM | POA: Diagnosis not present

## 2023-04-09 DIAGNOSIS — N920 Excessive and frequent menstruation with regular cycle: Secondary | ICD-10-CM | POA: Diagnosis present

## 2023-04-09 DIAGNOSIS — N711 Chronic inflammatory disease of uterus: Secondary | ICD-10-CM | POA: Diagnosis not present

## 2023-04-09 DIAGNOSIS — K219 Gastro-esophageal reflux disease without esophagitis: Secondary | ICD-10-CM | POA: Insufficient documentation

## 2023-04-09 DIAGNOSIS — Z419 Encounter for procedure for purposes other than remedying health state, unspecified: Secondary | ICD-10-CM

## 2023-04-09 HISTORY — PX: DILATION AND EVACUATION: SHX1459

## 2023-04-09 LAB — PROTIME-INR
INR: 1.1 (ref 0.8–1.2)
Prothrombin Time: 14.7 s (ref 11.4–15.2)

## 2023-04-09 LAB — BASIC METABOLIC PANEL
Anion gap: 9 (ref 5–15)
BUN: 11 mg/dL (ref 6–20)
CO2: 27 mmol/L (ref 22–32)
Calcium: 8.7 mg/dL — ABNORMAL LOW (ref 8.9–10.3)
Chloride: 102 mmol/L (ref 98–111)
Creatinine, Ser: 0.44 mg/dL (ref 0.44–1.00)
GFR, Estimated: >60 mL/min (ref >60)
Glucose, Bld: 121 mg/dL — ABNORMAL HIGH (ref 70–99)
Potassium: 3.8 mmol/L (ref 3.5–5.1)
Sodium: 138 mmol/L (ref 135–145)

## 2023-04-09 LAB — TYPE AND SCREEN
ABO/RH(D): O POS
Antibody Screen: POSITIVE

## 2023-04-09 LAB — CBC
HCT: 29.7 % — ABNORMAL LOW (ref 36.0–46.0)
Hemoglobin: 10.2 g/dL — ABNORMAL LOW (ref 12.0–15.0)
MCH: 29.6 pg (ref 26.0–34.0)
MCHC: 34.3 g/dL (ref 30.0–36.0)
MCV: 86.1 fL (ref 80.0–100.0)
Platelets: 274 10*3/uL (ref 150–400)
RBC: 3.45 MIL/uL — ABNORMAL LOW (ref 3.87–5.11)
RDW: 12.7 % (ref 11.5–15.5)
WBC: 8 10*3/uL (ref 4.0–10.5)
nRBC: 0 % (ref 0.0–0.2)

## 2023-04-09 LAB — POCT PREGNANCY, URINE: Preg Test, Ur: NEGATIVE

## 2023-04-09 SURGERY — DILATION AND EVACUATION, UTERUS
Anesthesia: General

## 2023-04-09 MED ORDER — OXYCODONE HCL 5 MG PO TABS
ORAL_TABLET | ORAL | Status: AC
  Filled 2023-04-09: qty 1

## 2023-04-09 MED ORDER — MIDAZOLAM HCL 2 MG/2ML IJ SOLN
INTRAMUSCULAR | Status: AC
  Filled 2023-04-09: qty 2

## 2023-04-09 MED ORDER — LIDOCAINE-EPINEPHRINE 1 %-1:100000 IJ SOLN
INTRAMUSCULAR | Status: DC | PRN
  Administered 2023-04-09: 24 mL

## 2023-04-09 MED ORDER — CEFAZOLIN SODIUM-DEXTROSE 2-4 GM/100ML-% IV SOLN
INTRAVENOUS | Status: AC
  Filled 2023-04-09: qty 100

## 2023-04-09 MED ORDER — LACTATED RINGERS IV SOLN: INTRAVENOUS | Status: DC

## 2023-04-09 MED ORDER — MIDAZOLAM HCL 2 MG/2ML IJ SOLN
INTRAMUSCULAR | Status: DC | PRN
  Administered 2023-04-09 (×2): 1 mg via INTRAVENOUS

## 2023-04-09 MED ORDER — BUPIVACAINE HCL (PF) 0.25 % IJ SOLN
INTRAMUSCULAR | Status: AC
  Filled 2023-04-09: qty 30

## 2023-04-09 MED ORDER — OXYCODONE HCL 5 MG/5ML PO SOLN: 5.0000 mg | Freq: Once | ORAL | Status: AC | PRN

## 2023-04-09 MED ORDER — PROPOFOL 10 MG/ML IV BOLUS
INTRAVENOUS | Status: AC
  Filled 2023-04-09: qty 20

## 2023-04-09 MED ORDER — TRANEXAMIC ACID-NACL 1000-0.7 MG/100ML-% IV SOLN
INTRAVENOUS | Status: AC
  Filled 2023-04-09: qty 100

## 2023-04-09 MED ORDER — ONDANSETRON HCL 4 MG/2ML IJ SOLN
INTRAMUSCULAR | Status: DC | PRN
  Administered 2023-04-09: 4 mg via INTRAVENOUS

## 2023-04-09 MED ORDER — TRANEXAMIC ACID-NACL 1000-0.7 MG/100ML-% IV SOLN
1000.0000 mg | Freq: Once | INTRAVENOUS | Status: AC
  Administered 2023-04-09: 1000 mg via INTRAVENOUS

## 2023-04-09 MED ORDER — FENTANYL CITRATE (PF) 100 MCG/2ML IJ SOLN
25.0000 ug | INTRAMUSCULAR | Status: DC | PRN
Start: 2023-04-09 — End: 2023-04-09
  Administered 2023-04-09: 50 ug via INTRAVENOUS
  Administered 2023-04-09 (×2): 25 ug via INTRAVENOUS

## 2023-04-09 MED ORDER — FENTANYL CITRATE (PF) 100 MCG/2ML IJ SOLN
INTRAMUSCULAR | Status: AC
  Filled 2023-04-09: qty 2

## 2023-04-09 MED ORDER — PROPOFOL 10 MG/ML IV BOLUS
INTRAVENOUS | Status: DC | PRN
  Administered 2023-04-09: 125 ug/kg/min via INTRAVENOUS

## 2023-04-09 MED ORDER — ACETAMINOPHEN 10 MG/ML IV SOLN
INTRAVENOUS | Status: DC | PRN
  Administered 2023-04-09: 1000 mg via INTRAVENOUS

## 2023-04-09 MED ORDER — ACETAMINOPHEN 10 MG/ML IV SOLN
INTRAVENOUS | Status: AC
  Filled 2023-04-09: qty 100

## 2023-04-09 MED ORDER — ORAL CARE MOUTH RINSE: 15.0000 mL | Freq: Once | OROMUCOSAL | Status: AC

## 2023-04-09 MED ORDER — CHLORHEXIDINE GLUCONATE 0.12 % MT SOLN
15.0000 mL | Freq: Once | OROMUCOSAL | Status: AC
  Administered 2023-04-09: 15 mL via OROMUCOSAL

## 2023-04-09 MED ORDER — OXYCODONE HCL 5 MG PO TABS: 5.0000 mg | ORAL_TABLET | ORAL | 0 refills | Status: AC | PRN

## 2023-04-09 MED ORDER — FENTANYL CITRATE (PF) 100 MCG/2ML IJ SOLN
INTRAMUSCULAR | Status: DC | PRN
  Administered 2023-04-09 (×2): 50 ug via INTRAVENOUS

## 2023-04-09 MED ORDER — LIDOCAINE HCL (CARDIAC) PF 100 MG/5ML IV SOSY
PREFILLED_SYRINGE | INTRAVENOUS | Status: DC | PRN
  Administered 2023-04-09: 100 mg via INTRAVENOUS

## 2023-04-09 MED ORDER — DEXAMETHASONE SODIUM PHOSPHATE 10 MG/ML IJ SOLN
INTRAMUSCULAR | Status: DC | PRN
  Administered 2023-04-09: 10 mg via INTRAVENOUS

## 2023-04-09 MED ORDER — KETOROLAC TROMETHAMINE 30 MG/ML IJ SOLN
INTRAMUSCULAR | Status: DC | PRN
  Administered 2023-04-09: 30 mg via INTRAVENOUS

## 2023-04-09 MED ORDER — LIDOCAINE-EPINEPHRINE 1 %-1:100000 IJ SOLN
INTRAMUSCULAR | Status: AC
  Filled 2023-04-09: qty 1

## 2023-04-09 MED ORDER — CEFAZOLIN SODIUM-DEXTROSE 2-3 GM-%(50ML) IV SOLR
INTRAVENOUS | Status: DC | PRN
  Administered 2023-04-09: 2 g via INTRAVENOUS

## 2023-04-09 MED ORDER — OXYCODONE HCL 5 MG PO TABS
5.0000 mg | ORAL_TABLET | Freq: Once | ORAL | Status: AC | PRN
  Administered 2023-04-09: 5 mg via ORAL

## 2023-04-09 MED ORDER — ACETAMINOPHEN EXTRA STRENGTH 500 MG PO TABS: 1000.0000 mg | ORAL_TABLET | Freq: Four times a day (QID) | ORAL | 0 refills | Status: AC

## 2023-04-09 MED ORDER — IBUPROFEN 800 MG PO TABS: 800.0000 mg | ORAL_TABLET | Freq: Three times a day (TID) | ORAL | 0 refills | Status: AC

## 2023-04-09 MED ORDER — CHLORHEXIDINE GLUCONATE 0.12 % MT SOLN
OROMUCOSAL | Status: AC
Start: 2023-04-09 — End: ?
  Filled 2023-04-09: qty 15

## 2023-04-09 SURGICAL SUPPLY — 33 items
DRSG TELFA 3X8 NADH STRL (GAUZE/BANDAGES/DRESSINGS) IMPLANT
FILTER UTR ASPR SPEC (MISCELLANEOUS) ×1 IMPLANT
FLTR UTR ASPR SPEC (MISCELLANEOUS) ×1
GAUZE 4X4 16PLY ~~LOC~~+RFID DBL (SPONGE) IMPLANT
GLOVE BIO SURGEON STRL SZ7 (GLOVE) ×1 IMPLANT
GLOVE BIOGEL PI IND STRL 7.5 (GLOVE) ×1 IMPLANT
GOWN STRL REUS W/ TWL LRG LVL3 (GOWN DISPOSABLE) ×2 IMPLANT
HANDLE YANKAUER SUCT BULB TIP (MISCELLANEOUS) IMPLANT
KIT BERKELEY 1ST TRIMESTER 3/8 (MISCELLANEOUS) ×1 IMPLANT
KIT TURNOVER CYSTO (KITS) ×1 IMPLANT
MANIFOLD NEPTUNE II (INSTRUMENTS) ×1 IMPLANT
NDL HYPO 22X1.5 SAFETY MO (MISCELLANEOUS) IMPLANT
NDL SPNL 22GX3.5 QUINCKE BK (NEEDLE) IMPLANT
NEEDLE HYPO 22X1.5 SAFETY MO (MISCELLANEOUS) IMPLANT
NEEDLE SPNL 22GX3.5 QUINCKE BK (NEEDLE) ×1 IMPLANT
PACK DNC HYST (MISCELLANEOUS) ×1 IMPLANT
PAD OB MATERNITY 4.3X12.25 (PERSONAL CARE ITEMS) ×1 IMPLANT
PAD PREP OB/GYN DISP 24X41 (PERSONAL CARE ITEMS) ×1 IMPLANT
SCRUB CHG 4% DYNA-HEX 4OZ (MISCELLANEOUS) ×1 IMPLANT
SET BERKELEY SUCTION TUBING (SUCTIONS) ×1 IMPLANT
SET CYSTO W/LG BORE CLAMP LF (SET/KITS/TRAYS/PACK) IMPLANT
SOL PREP PVP 2OZ (MISCELLANEOUS) ×1
SOLUTION PREP PVP 2OZ (MISCELLANEOUS) ×1 IMPLANT
SYR 10ML LL (SYRINGE) IMPLANT
SYR CONTROL 10ML LL (SYRINGE) IMPLANT
TOWEL OR 17X26 4PK STRL BLUE (TOWEL DISPOSABLE) ×1 IMPLANT
TRAP FLUID SMOKE EVACUATOR (MISCELLANEOUS) ×1 IMPLANT
VACURETTE 10 RIGID CVD (CANNULA) IMPLANT
VACURETTE 6 ASPIR F TIP BERK (CANNULA) IMPLANT
VACURETTE 7MM F TIP STRL (CANNULA) IMPLANT
VACURETTE 8 RIGID CVD (CANNULA) IMPLANT
VACURETTE 8MM F TIP (MISCELLANEOUS) ×1 IMPLANT
WATER STERILE IRR 500ML POUR (IV SOLUTION) ×1 IMPLANT

## 2023-04-09 NOTE — Op Note (Addendum)
Operative Report Suction Dilation and Curettage   Indications: Anemia, AUB  Pre-operative Diagnosis: Symptomatic uterine bleeding 38days after cesarean section delivery.  Post-operative Diagnosis: same.  Procedure: 1. Suction D&C  Surgeon: Christeen Douglas, MD  Assistant(s):  None  Anesthesia: Local anesthesia 1% plain lidocaine, 0.25.% bupivacaine, with epinephrine and Monitored Local Anesthesia with Sedation  Anesthesiologist: Louie Boston, MD Anesthesiologist: Louie Boston, MD CRNA: Jeanine Luz, CRNA; Michelet, Judeth Cornfield, CRNA  Estimated Blood Loss:  less than 50 mL         Intraoperative medications:  toradol, TXA         Total IV Fluids:  Urine Output: 50ml         Specimens: endometrial curettings         Complications:  None; patient tolerated the procedure well.         Disposition: PACU - hemodynamically stable.         Condition: stable  Findings: Uterus measuring 9 weeks; normal cervix, vagina, perineum. Minimally open cervix with large clots in the vaginal vault and at the external os  Indication for procedure/Consents: 32 y.o. [id number]  here for scheduled surgery for the aforementioned diagnoses.  Spanish interpreter present by IOW throughout.   Risks of surgery were discussed with the patient including but not limited to: bleeding which may require transfusion; infection which may require antibiotics; injury to uterus or surrounding organs; intrauterine scarring which may impair future fertility; need for additional procedures including laparotomy or laparoscopy; and other postoperative/anesthesia complications. Written informed consent was obtained.    Procedure Details:   She was then taken to the operating room where general anesthesia was administered and was found to be adequate.  After a formal and adequate timeout was performed, she was placed in the dorsal lithotomy position and examined with the above findings. She was then prepped and  draped in the sterile manner.   Her bladder was catheterized for an estimated amount of clear, yellow urine. A speculum was then placed in the patient's vagina and a single tooth tenaculum was applied to the anterior lip of the cervix.    Her uterus sounded to 9cm/ Her cervix was serially dilated to accommodate a 6 sized flexible suction curette.  A sharp curettage was then performed until there was a gritty texture in all four quadrants.  The tenaculum was removed from the anterior lip of the cervix and the vaginal speculum was removed after noting good hemostasis. The patient tolerated the procedure well and was taken to the recovery area awake, extubated and in stable condition.  The patient will be discharged to home as per PACU criteria.  She will receive another dose of oral antibiotics prior to discharge. Routine postoperative instructions given.  She was prescribed Percocet, Ibuprofen and Colace.  She will follow up in the clinic in two weeks for postoperative evaluation.

## 2023-04-09 NOTE — Discharge Instructions (Signed)
Discharge instructions after a hysteroscopy with dilation and curettage ? ?Signs and Symptoms to Report ? ?Call our office at [phone] if you have any of the following:  ? ? Fever over 100.4 degrees or higher ? Severe stomach pain not relieved with pain medications ? Bright red bleeding that?s heavier than a period that does not slow with rest after the first 24 hours ? To go the bathroom a lot (frequency), you can?t hold your urine (urgency), or it hurts when you empty your bladder (urinate) ? Chest pain ? Shortness of breath ? Pain in the calves of your legs ? Severe nausea and vomiting not relieved with anti-nausea medications ? Any concerns ? ?What You Can Expect after Surgery ? You may see some pink tinged, bloody fluid. This is normal. You may also have cramping for several days.  ? ?Activities after Your Discharge ?Follow these guidelines to help speed your recovery at home: ? Don?t drive if you are in pain or taking narcotic pain medicine. You may drive when you can safely slam on the brakes, turn the wheel forcefully, and rotate your torso comfortably. This is typically 4-7 days. Practice in a parking lot or side street prior to attempting to drive regularly.  ? Ask others to help with household chores for 4 weeks. ? Don?t do strenuous activities, exercises, or sports like vacuuming, tennis, squash, etc. until your doctor says it is safe to do so. ? Walk as you feel able. Rest often since it may take a week or two for your energy level to return to normal.  ? You may climb stairs ? Avoid constipation: ?  -Eat fruits, vegetables, and whole grains. Eat small meals as your appetite will take time to return to normal. ?  -Drink 6 to 8 glasses of water each day unless your doctor has told you to limit your fluids. ?  -Use a laxative or stool softener as needed if constipation becomes a problem. You may take Miralax, metamucil, Citrucil, Colace, Senekot, FiberCon, etc. If this does not relieve the  constipation, try two tablespoons of Milk Of Magnesia every 8 hours until your bowels move.  ? You may shower.  ? Do not get in a hot tub, swimming pool, etc. until your doctor agrees. ? Do not douche, use tampons, or have sex until your doctor says it is okay, usually about 2 weeks. ? Take your pain medicine when you need it. The medicine may not work as well if the pain is bad. ? ?Take the medicines you were taking before surgery. Other medications you might need are pain medications (ibuprofen), medications for constipation (Colace) and nausea medications (Zofran).  ? ?  ?

## 2023-04-09 NOTE — Anesthesia Preprocedure Evaluation (Addendum)
Anesthesia Evaluation  Patient identified by MRN, date of birth, ID band Patient awake    Reviewed: Allergy & Precautions, NPO status , Patient's Chart, lab work & pertinent test results  History of Anesthesia Complications Negative for: history of anesthetic complications  Airway Mallampati: III  TM Distance: <3 FB Neck ROM: full    Dental  (+) Chipped   Pulmonary neg pulmonary ROS, neg shortness of breath   Pulmonary exam normal        Cardiovascular (-) angina (-) Past MI negative cardio ROS Normal cardiovascular exam     Neuro/Psych negative neurological ROS  negative psych ROS   GI/Hepatic Neg liver ROS,GERD  Controlled,,  Endo/Other  negative endocrine ROS    Renal/GU      Musculoskeletal   Abdominal   Peds  Hematology  (+) Blood dyscrasia, anemia   Anesthesia Other Findings Past Medical History: 04/09/2017: Cesarean delivery delivered 04/09/2017: Chorioamnionitis No date: Gastritis No date: GERD (gastroesophageal reflux disease)  Past Surgical History: 04/09/2017: CESAREAN SECTION; N/A     Comment:  Procedure: CESAREAN SECTION;  Surgeon: Natale Milch, MD;  Location: [facility] ORS;  Service:               Obstetrics;  Laterality: N/A; 03/01/2023: CESAREAN SECTION     Comment:  Procedure: CESAREAN SECTION;  Surgeon: Christeen Douglas,              MD;  Location: [facility] ORS;  Service: Obstetrics;;     Reproductive/Obstetrics negative OB ROS                             Anesthesia Physical Anesthesia Plan  ASA: 3  Anesthesia Plan: General   Post-op Pain Management: Ofirmev IV (intra-op)* and Toradol IV (intra-op)*   Induction: Intravenous  PONV Risk Score and Plan: 2 and Midazolam, Treatment may vary due to age or medical condition, Propofol infusion and TIVA  Airway Management Planned: Natural Airway and Nasal Cannula  Additional Equipment:    Intra-op Plan:   Post-operative Plan:   Informed Consent: I have reviewed the patients History and Physical, chart, labs and discussed the procedure including the risks, benefits and alternatives for the proposed anesthesia with the patient or authorized representative who has indicated his/her understanding and acceptance.     Dental Advisory Given and Interpreter used for interview  Plan Discussed with: Anesthesiologist, CRNA and Surgeon  Anesthesia Plan Comments: (Patient consented for risks of anesthesia including but not limited to:  - adverse reactions to medications - damage to eyes, teeth, lips or other oral mucosa - nerve damage due to positioning  - sore throat or hoarseness - Damage to heart, brain, nerves, lungs, other parts of body or loss of life  Patient voiced understanding and assent.)       Anesthesia Quick Evaluation

## 2023-04-09 NOTE — Transfer of Care (Signed)
Immediate Anesthesia Transfer of Care Note  Patient: Emily Ruiz  Procedure(s) Performed: SUCTION DILATATION AND EVACUATION  Patient Location: PACU  Anesthesia Type:General  Level of Consciousness: awake, alert , and oriented  Airway & Oxygen Therapy: Patient Spontanous Breathing  Post-op Assessment: Report given to RN and Post -op Vital signs reviewed and stable  Post vital signs: Reviewed and stable  Last Vitals:  Vitals Value Taken Time  BP 118/77   Temp    Pulse 111 04/09/23 1642  Resp 44 04/09/23 1642  SpO2 100 % 04/09/23 1642  Vitals shown include unfiled device data.  Last Pain:  Vitals:   04/09/23 1518  TempSrc: Tympanic      Patients Stated Pain Goal: 0 (04/09/23 1518)  Complications: No notable events documented.

## 2023-04-09 NOTE — H&P (Signed)
[name] is a 32 y.o. female here for Follow-up (ED follow up - vaginal bleeding started Saturday night and has gotten heavier since and is very weak, pale, has no strength, was like a normal period and now having clots going to the bathroom every 20-30 minutes starting this morning )   Referring provider: Self   History of Present Illness: Patient returns today for ER follow up. She went to the ED 04/07/23 for vaginal bleeding following an uncomplicated rLTCS on 03/01/23. C/S indications: desired repeat. Baby girl "Genesis"   She had an ultrasound 04/07/23 which was normal. Her bleeding did not improve. When she got home, she started having very large blood clots.    Her periods are historically strong, but they are never this heavy. She is scared.      Pertinent Hx: - C/S delivery x 2  - IUD in the past with heavy bleeding (copper?)    Past Medical History:  has no past medical history on file.  Past Surgical History:  has a past surgical history that includes Cesarean section. Family History: family history is not on file. Social History:  reports that she has never smoked. She has never used smokeless tobacco. She reports that she does not currently use alcohol. She reports that she does not use drugs. OB/GYN History:  OB History       Gravida 2   Para 2   Term 2   Preterm     AB     Living 2       SAB     IAB     Ectopic     Molar     Multiple     Live Births 2        Allergies: has no allergies on file. Medications: Current Medications Current Outpatient Medications:    acetaminophen (TYLENOL) 500 MG tablet, Take 1,000 mg by mouth every 6 (six) hours, Disp: , Rfl:    FEROSUL 325 mg (65 mg iron) tablet, , Disp: , Rfl:    prenatal vitamin-iron-FA (PRENATE PLUS) tablet, Take 1 tablet by mouth once daily, Disp: , Rfl:    albuterol MDI, PROVENTIL, VENTOLIN, PROAIR, HFA 90 mcg/actuation inhaler, Inhale 2 inhalations into the lungs every 6  (six) hours as needed for Wheezing or Shortness of Breath (Patient not taking: Reported on 01/11/2023), Disp: , Rfl:    ascorbic acid, vitamin C, (VITAMIN C) 500 MG tablet, Take 500 mg by mouth once daily (Patient not taking: Reported on 04/09/2023), Disp: , Rfl:    cholecalciferol (VITAMIN D3) 400 unit tablet, Take 800 Units by mouth once daily (Patient not taking: Reported on 04/09/2023), Disp: , Rfl:    ibuprofen (MOTRIN) 600 MG tablet, Take 600 mg by mouth every 6 (six) hours (Patient not taking: Reported on 04/09/2023), Disp: , Rfl:    oxyCODONE (ROXICODONE) 5 MG immediate release tablet, Take by mouth (Patient not taking: Reported on 04/09/2023), Disp: , Rfl:    sennosides-docusate (SENOKOT-S) 8.6-50 mg tablet, Take 2 tablets by mouth once daily (Patient not taking: Reported on 04/09/2023), Disp: , Rfl:     Review of Systems: No SOB, no palpitations or chest pain, no new lower extremity edema, no nausea or vomiting or bowel or bladder complaints. See HPI for gyn specific ROS.    Exam:   BP (!) 132/94   Pulse (!) 122   Ht 160 cm (5\' 3" )   Wt 100.2 kg (221 lb)   Breastfeeding Yes   BMI 39.15  kg/m   Repeat HR: 145 bpm    Constitutional:  General appearance: Well nourished, well developed female  Neuro/psych:  Normal mood and affect. No gross motor deficits. Neck:  Supple, normal appearance.  Respiratory:  Normal respiratory effort, no use of accessory muscles Skin:  No visible rashes or external lesions    Incision: well healed, no significant erythema, no drainage, abdomen soft and non tender   Impression:   The primary encounter diagnosis was Abnormal uterine bleeding (AUB). A diagnosis of Excessive or frequent menstruation was also pertinent to this visit.   Plan:   - Menorrhagia, AUB - I reviewed the ultrasound results from the emergency room 2 days ago which was not concerning for uterine rupture, infection or retained placenta. Her exam was also not concerning for uterine  infection.  - Will get CBC again today  - Can start Lysteda or go to the OR for D&C with hysteroscopy and progesterone IUD.  - With her tachycardia, I discussed with the patient that I recommend continuing with a D&C with hysteroscopy.    She is added on the OR schedule for now.   Diagnoses and all orders for this visit:   Abnormal uterine bleeding (AUB) -     CBC w/auto Differential (5 Part)   Excessive or frequent menstruation

## 2023-04-09 NOTE — Anesthesia Procedure Notes (Signed)
Date/Time: 04/09/2023 4:00 PM  Performed by: Ginger Carne, CRNAPre-anesthesia Checklist: Patient identified, Emergency Drugs available, Suction available, Patient being monitored and Timeout performed Patient Re-evaluated:Patient Re-evaluated prior to induction Oxygen Delivery Method: Simple face mask Preoxygenation: Pre-oxygenation with 100% oxygen Induction Type: IV induction

## 2023-04-10 DIAGNOSIS — I872 Venous insufficiency (chronic) (peripheral): Secondary | ICD-10-CM | POA: Insufficient documentation

## 2023-04-10 DIAGNOSIS — I8310 Varicose veins of unspecified lower extremity with inflammation: Secondary | ICD-10-CM | POA: Insufficient documentation

## 2023-04-10 NOTE — Progress Notes (Deleted)
MRN : [id number]  Emily Ruiz Emily Ruiz is a 32 y.o. (11-Nov-1990) female who presents with chief complaint of varicose veins hurt.  History of Present Illness:   The patient is seen for evaluation of symptomatic varicose veins. The patient relates burning and stinging which worsened steadily throughout the course of the day, particularly with standing. The patient also notes an aching and throbbing pain over the varicosities, particularly with prolonged dependent positions. The symptoms are significantly improved with elevation.  The patient also notes that during hot weather the symptoms are greatly intensified. The patient states the pain from the varicose veins interferes with work, daily exercise, shopping and household maintenance. At this point, the symptoms are persistent and severe enough that they're having a negative impact on lifestyle and are interfering with daily activities.  There is no history of DVT, PE or superficial thrombophlebitis. There is no history of ulceration or hemorrhage. The patient denies a significant family history of varicose veins.  The patient has not worn graduated compression in the past. At the present time the patient has not been using over-the-counter analgesics. There is no history of prior surgical intervention or sclerotherapy.   No outpatient medications have been marked as taking for the 04/11/23 encounter (Appointment) with Gilda Crease, Latina Craver, MD.    Past Medical History:  Diagnosis Date   Cesarean delivery delivered 04/09/2017   Chorioamnionitis 04/09/2017   Gastritis    GERD (gastroesophageal reflux disease)     Past Surgical History:  Procedure Laterality Date   CESAREAN SECTION N/A 04/09/2017   Procedure: CESAREAN SECTION;  Surgeon: Natale Milch, MD;  Location: [facility];  Service: Obstetrics;  Laterality: N/A;   CESAREAN SECTION  03/01/2023   Procedure: CESAREAN SECTION;  Surgeon: Christeen Douglas, MD;   Location: [facility];  Service: Obstetrics;;    Social History Social History   Tobacco Use   Smoking status: Never   Smokeless tobacco: Never  Vaping Use   Vaping status: Never Used  Substance Use Topics   Alcohol use: No   Drug use: No    Family History Family History  Problem Relation Age of Onset   Breast cancer Neg Hx     No Known Allergies   REVIEW OF SYSTEMS (Negative unless checked)  Constitutional: [] Weight loss  [] Fever  [] Chills Cardiac: [] Chest pain   [] Chest pressure   [] Palpitations   [] Shortness of breath when laying flat   [] Shortness of breath with exertion. Vascular:  [] Pain in legs with walking   [x] Pain in legs with standing  [] History of DVT   [] Phlebitis   [] Swelling in legs   [x] Varicose veins   [] Non-healing ulcers Pulmonary:   [] Uses home oxygen   [] Productive cough   [] Hemoptysis   [] Wheeze  [] COPD   [] Asthma Neurologic:  [] Dizziness   [] Seizures   [] History of stroke   [] History of TIA  [] Aphasia   [] Vissual changes   [] Weakness or numbness in arm   [] Weakness or numbness in leg Musculoskeletal:   [] Joint swelling   [] Joint pain   [] Low back pain Hematologic:  [] Easy bruising  [] Easy bleeding   [] Hypercoagulable state   [] Anemic Gastrointestinal:  [] Diarrhea   [] Vomiting  [] Gastroesophageal reflux/heartburn   [] Difficulty swallowing. Genitourinary:  [] Chronic kidney disease   [] Difficult urination  [] Frequent urination   [] Blood in urine Skin:  [] Rashes   [] Ulcers  Psychological:  [] History of anxiety   []   History of major depression.  Physical Examination  There were no vitals filed for this visit. There is no height or weight on file to calculate BMI. Gen: WD/WN, NAD Head: [place]/AT, No temporalis wasting.  Ear/Nose/Throat: Hearing grossly intact, nares w/o erythema or drainage, pinna without lesions Eyes: PER, EOMI, sclera nonicteric.  Neck: Supple, no gross masses.  No JVD.  Pulmonary:  Good air movement, no audible wheezing, no use of  accessory muscles.  Cardiac: RRR, precordium not hyperdynamic. Vascular:  Large varicosities present, greater than 10 mm ***.  Veins are tender to palpation  Mild venous stasis changes to the legs bilaterally.  Trace soft pitting edema CEAP C3sEpAsPr Vessel Right Left  Radial Palpable Palpable  Gastrointestinal: soft, non-distended. No guarding/no peritoneal signs.  Musculoskeletal: M/S 5/5 throughout.  No deformity.  Neurologic: CN 2-12 intact. Pain and light touch intact in extremities.  Symmetrical.  Speech is fluent. Motor exam as listed above. Psychiatric: Judgment intact, Mood & affect appropriate for pt's clinical situation. Dermatologic: Venous rashes no ulcers noted.  No changes consistent with cellulitis. Lymph : No lichenification or skin changes of chronic lymphedema.  CBC Lab Results  Component Value Date   WBC 8.0 04/09/2023   HGB 10.2 (L) 04/09/2023   HCT 29.7 (L) 04/09/2023   MCV 86.1 04/09/2023   PLT 274 04/09/2023    BMET    Component Value Date/Time   NA 138 04/09/2023 1526   K 3.8 04/09/2023 1526   CL 102 04/09/2023 1526   CO2 27 04/09/2023 1526   GLUCOSE 121 (H) 04/09/2023 1526   BUN 11 04/09/2023 1526   CREATININE 0.44 04/09/2023 1526   CALCIUM 8.7 (L) 04/09/2023 1526   GFRNONAA >60 04/09/2023 1526   GFRAA >60 01/18/2019 2145   Estimated Creatinine Clearance: 110.4 mL/min (by C-G formula based on SCr of 0.44 mg/dL).  COAG Lab Results  Component Value Date   INR 1.1 04/09/2023   INR 1.1 03/01/2023    Radiology US Pelvis Complete  Result Date: 04/08/2023 CLINICAL DATA:  C-section 03/01/2023, still passing clots. EXAM: TRANSABDOMINAL ULTRASOUND OF PELVIS COLOR DOPPLER ULTRASOUND OF OVARIES TECHNIQUE: Transabdominal ultrasound examination of the pelvis was performed including evaluation of the uterus, ovaries, adnexal regions, and pelvic cul-de-sac. Color and duplex Doppler ultrasound was utilized to evaluate blood flow to the ovaries. COMPARISON:   There is no prior non-OB pelvic ultrasound. FINDINGS: Uterus Measurements: Anteverted measuring 11.3 x 5.6 x 6.4 cm, mildly prominent probably due to the recent prior = volume: 211.9 mL. No fibroids or other mass visualized. Please note however, the cervix is poorly visualized due to bowel gas shadowing. Endometrium Thickness: 9.9 mm.  No focal abnormality visualized. Right ovary Measurements: 4.3 x 3.4 x 3.7 cm = volume: 28 mL. There is a 3.5 cm avascular hemorrhagic cyst with hypoechoic clot retracted to its center. Left ovary Measurements: 3.1 x 2.1 x 2.2 cm = volume: 7.6 mL. Normal appearance/no adnexal mass. Color doppler evaluation demonstrates normal appearing color flow. Other: No free fluid or adnexal mass are seen. The visualized bladder is unremarkable. IMPRESSION: 1. 3.5 cm hemorrhagic cyst in the right ovary. Follow-up ultrasound suggested 6-8 weeks to ensure resolution. 2. No other significant findings. Color flow was documented to both ovaries. 3. The cervix is poorly visualized due to bowel gas shadowing. Electronically Signed   By: Almira Bar M.D.   On: 04/08/2023 00:41     Assessment/Plan There are no diagnoses linked to this encounter.   Levora Dredge, MD  04/10/2023 8:14 AM

## 2023-04-11 ENCOUNTER — Encounter: Payer: Self-pay | Admitting: Obstetrics and Gynecology

## 2023-04-11 ENCOUNTER — Encounter (INDEPENDENT_AMBULATORY_CARE_PROVIDER_SITE_OTHER): Payer: MEDICAID

## 2023-04-11 ENCOUNTER — Encounter (INDEPENDENT_AMBULATORY_CARE_PROVIDER_SITE_OTHER): Payer: MEDICAID | Admitting: Vascular Surgery

## 2023-04-11 DIAGNOSIS — I872 Venous insufficiency (chronic) (peripheral): Secondary | ICD-10-CM

## 2023-04-11 DIAGNOSIS — I8310 Varicose veins of unspecified lower extremity with inflammation: Secondary | ICD-10-CM

## 2023-04-11 LAB — TYPE AND SCREEN
ABO/RH(D): O POS
Antibody Screen: POSITIVE
DAT, IgG: NEGATIVE
DAT, complement: NEGATIVE

## 2023-04-11 LAB — SURGICAL PATHOLOGY

## 2023-04-11 NOTE — Anesthesia Postprocedure Evaluation (Signed)
Anesthesia Post Note  Patient: Emily Ruiz  Procedure(s) Performed: SUCTION DILATATION AND EVACUATION  Patient location during evaluation: PACU Anesthesia Type: General Level of consciousness: awake and alert Pain management: pain level controlled Vital Signs Assessment: post-procedure vital signs reviewed and stable Respiratory status: spontaneous breathing, nonlabored ventilation, respiratory function stable and patient connected to nasal cannula oxygen Cardiovascular status: blood pressure returned to baseline and stable Postop Assessment: no apparent nausea or vomiting Anesthetic complications: no   No notable events documented.   Last Vitals:  Vitals:   04/09/23 1741 04/09/23 1815  BP: 125/79 108/64  Pulse: 96 94  Resp: 19 20  Temp: 36.7 C   SpO2: 99% 97%    Last Pain:  Vitals:   04/10/23 0843  TempSrc:   PainSc: 1                  Louie Boston

## 2023-05-26 NOTE — Progress Notes (Deleted)
 MRN : [id number]  Emily Ruiz is a 33 y.o. (March 02, 1991) female who presents with chief complaint of legs hurt and swell.  History of Present Illness: ***  No outpatient medications have been marked as taking for the 05/27/23 encounter (Appointment) with Jama, Cordella MATSU, MD.    Past Medical History:  Diagnosis Date   Cesarean delivery delivered 04/09/2017   Chorioamnionitis 04/09/2017   Gastritis    GERD (gastroesophageal reflux disease)     Past Surgical History:  Procedure Laterality Date   CESAREAN SECTION N/A 04/09/2017   Procedure: CESAREAN SECTION;  Surgeon: Victor Claudell SAUNDERS, MD;  Location: [facility];  Service: Obstetrics;  Laterality: N/A;   CESAREAN SECTION  03/01/2023   Procedure: CESAREAN SECTION;  Surgeon: Verdon Keen, MD;  Location: [facility];  Service: Obstetrics;;   DILATION AND EVACUATION N/A 04/09/2023   Procedure: SUCTION DILATATION AND EVACUATION;  Surgeon: Verdon Keen, MD;  Location: [facility];  Service: Gynecology;  Laterality: N/A;    Social History Social History   Tobacco Use   Smoking status: Never   Smokeless tobacco: Never  Vaping Use   Vaping status: Never Used  Substance Use Topics   Alcohol use: No   Drug use: No    Family History Family History  Problem Relation Age of Onset   Breast cancer Neg Hx     No Known Allergies   REVIEW OF SYSTEMS (Negative unless checked)  Constitutional: [] Weight loss  [] Fever  [] Chills Cardiac: [] Chest pain   [] Chest pressure   [] Palpitations   [] Shortness of breath when laying flat   [] Shortness of breath with exertion. Vascular:  [] Pain in legs with walking   [x] Pain in legs at rest  [] History of DVT   [] Phlebitis   [x] Swelling in legs   [] Varicose veins   [] Non-healing ulcers Pulmonary:   [] Uses home oxygen   [] Productive cough   [] Hemoptysis   [] Wheeze  [] COPD   [] Asthma Neurologic:  [] Dizziness   [] Seizures   [] History of stroke   [] History of TIA  [] Aphasia    [] Vissual changes   [] Weakness or numbness in arm   [] Weakness or numbness in leg Musculoskeletal:   [] Joint swelling   [] Joint pain   [] Low back pain Hematologic:  [] Easy bruising  [] Easy bleeding   [] Hypercoagulable state   [] Anemic Gastrointestinal:  [] Diarrhea   [] Vomiting  [] Gastroesophageal reflux/heartburn   [] Difficulty swallowing. Genitourinary:  [] Chronic kidney disease   [] Difficult urination  [] Frequent urination   [] Blood in urine Skin:  [] Rashes   [] Ulcers  Psychological:  [] History of anxiety   []  History of major depression.  Physical Examination  There were no vitals filed for this visit. There is no height or weight on file to calculate BMI. Gen: WD/WN, NAD Head: [place]/AT, No temporalis wasting.  Ear/Nose/Throat: Hearing grossly intact, nares w/o erythema or drainage, pinna without lesions Eyes: PER, EOMI, sclera nonicteric.  Neck: Supple, no gross masses.  No JVD.  Pulmonary:  Good air movement, no audible wheezing, no use of accessory muscles.  Cardiac: RRR, precordium not hyperdynamic. Vascular:  scattered varicosities present bilaterally.  Moderate venous stasis changes to the legs bilaterally.  2+ soft pitting edema. CEAP C4sEpAsPr   Vessel Right Left  Radial Palpable Palpable  Gastrointestinal: soft, non-distended. No guarding/no peritoneal signs.  Musculoskeletal: M/S 5/5 throughout.  No deformity.  Neurologic: CN 2-12 intact. Pain and light touch intact in extremities.  Symmetrical.  Speech is fluent. Motor exam  as listed above. Psychiatric: Judgment intact, Mood & affect appropriate for pt's clinical situation. Dermatologic: Venous rashes no ulcers noted.  No changes consistent with cellulitis. Lymph : No lichenification or skin changes of chronic lymphedema.  CBC Lab Results  Component Value Date   WBC 8.0 04/09/2023   HGB 10.2 (L) 04/09/2023   HCT 29.7 (L) 04/09/2023   MCV 86.1 04/09/2023   PLT 274 04/09/2023    BMET    Component Value Date/Time    NA 138 04/09/2023 1526   K 3.8 04/09/2023 1526   CL 102 04/09/2023 1526   CO2 27 04/09/2023 1526   GLUCOSE 121 (H) 04/09/2023 1526   BUN 11 04/09/2023 1526   CREATININE 0.44 04/09/2023 1526   CALCIUM 8.7 (L) 04/09/2023 1526   GFRNONAA >60 04/09/2023 1526   GFRAA >60 01/18/2019 2145   CrCl cannot be calculated (Patient's most recent lab result is older than the maximum 21 days allowed.).  COAG Lab Results  Component Value Date   INR 1.1 04/09/2023   INR 1.1 03/01/2023    Radiology No results found.   Assessment/Plan There are no diagnoses linked to this encounter.   Cordella Shawl, MD  05/26/2023 2:02 PM

## 2023-05-27 ENCOUNTER — Encounter (INDEPENDENT_AMBULATORY_CARE_PROVIDER_SITE_OTHER): Payer: MEDICAID

## 2023-05-27 ENCOUNTER — Encounter (INDEPENDENT_AMBULATORY_CARE_PROVIDER_SITE_OTHER): Payer: MEDICAID | Admitting: Vascular Surgery

## 2023-05-27 DIAGNOSIS — I8310 Varicose veins of unspecified lower extremity with inflammation: Secondary | ICD-10-CM

## 2023-05-27 DIAGNOSIS — I872 Venous insufficiency (chronic) (peripheral): Secondary | ICD-10-CM

## 2023-06-24 ENCOUNTER — Encounter (INDEPENDENT_AMBULATORY_CARE_PROVIDER_SITE_OTHER): Payer: MEDICAID | Admitting: Vascular Surgery

## 2023-06-24 ENCOUNTER — Encounter (INDEPENDENT_AMBULATORY_CARE_PROVIDER_SITE_OTHER): Payer: MEDICAID

## 2023-07-06 NOTE — Progress Notes (Unsigned)
 MRN : [id number]  Emily Ruiz Sondra Come is a 33 y.o. (05/31/1990) female who presents with chief complaint of varicose veins hurt.  History of Present Illness:   The patient is seen for evaluation of symptomatic varicose veins. The patient relates burning and stinging which worsened steadily throughout the course of the day, particularly with standing. The patient also notes an aching and throbbing pain over the varicosities, particularly with prolonged dependent positions. The symptoms are significantly improved with elevation.  The patient also notes that during hot weather the symptoms are greatly intensified. The patient states the pain from the varicose veins interferes with work, daily exercise, shopping and household maintenance. At this point, the symptoms are persistent and severe enough that they're having a negative impact on lifestyle and are interfering with daily activities.  There is no history of DVT, PE or superficial thrombophlebitis. There is no history of ulceration or hemorrhage. The patient denies a significant family history of varicose veins.  The patient has not worn graduated compression in the past. At the present time the patient has not been using over-the-counter analgesics. There is no history of prior surgical intervention or sclerotherapy.   No outpatient medications have been marked as taking for the 07/08/23 encounter (Appointment) with Gilda Crease, Latina Craver, MD.    Past Medical History:  Diagnosis Date   Cesarean delivery delivered 04/09/2017   Chorioamnionitis 04/09/2017   Gastritis    GERD (gastroesophageal reflux disease)     Past Surgical History:  Procedure Laterality Date   CESAREAN SECTION N/A 04/09/2017   Procedure: CESAREAN SECTION;  Surgeon: Natale Milch, MD;  Location: [facility];  Service: Obstetrics;  Laterality: N/A;   CESAREAN SECTION  03/01/2023   Procedure: CESAREAN SECTION;  Surgeon: Christeen Douglas, MD;   Location: [facility];  Service: Obstetrics;;   DILATION AND EVACUATION N/A 04/09/2023   Procedure: SUCTION DILATATION AND EVACUATION;  Surgeon: Christeen Douglas, MD;  Location: [facility];  Service: Gynecology;  Laterality: N/A;    Social History Social History   Tobacco Use   Smoking status: Never   Smokeless tobacco: Never  Vaping Use   Vaping status: Never Used  Substance Use Topics   Alcohol use: No   Drug use: No    Family History Family History  Problem Relation Age of Onset   Breast cancer Neg Hx     No Known Allergies   REVIEW OF SYSTEMS (Negative unless checked)  Constitutional: [] Weight loss  [] Fever  [] Chills Cardiac: [] Chest pain   [] Chest pressure   [] Palpitations   [] Shortness of breath when laying flat   [] Shortness of breath with exertion. Vascular:  [] Pain in legs with walking   [x] Pain in legs with standing  [] History of DVT   [] Phlebitis   [] Swelling in legs   [x] Varicose veins   [] Non-healing ulcers Pulmonary:   [] Uses home oxygen   [] Productive cough   [] Hemoptysis   [] Wheeze  [] COPD   [] Asthma Neurologic:  [] Dizziness   [] Seizures   [] History of stroke   [] History of TIA  [] Aphasia   [] Vissual changes   [] Weakness or numbness in arm   [] Weakness or numbness in leg Musculoskeletal:   [] Joint swelling   [] Joint pain   [] Low back pain Hematologic:  [] Easy bruising  [] Easy bleeding   [] Hypercoagulable state   [] Anemic Gastrointestinal:  [] Diarrhea   [] Vomiting  [] Gastroesophageal reflux/heartburn   [] Difficulty swallowing. Genitourinary:  [] Chronic  kidney disease   [] Difficult urination  [] Frequent urination   [] Blood in urine Skin:  [] Rashes   [] Ulcers  Psychological:  [] History of anxiety   []  History of major depression.  Physical Examination  There were no vitals filed for this visit. There is no height or weight on file to calculate BMI. Gen: WD/WN, NAD Head: [place]/AT, No temporalis wasting.  Ear/Nose/Throat: Hearing grossly intact, nares w/o erythema or  drainage, pinna without lesions Eyes: PER, EOMI, sclera nonicteric.  Neck: Supple, no gross masses.  No JVD.  Pulmonary:  Good air movement, no audible wheezing, no use of accessory muscles.  Cardiac: RRR, precordium not hyperdynamic. Vascular:  Large varicosities present, greater than 10 mm ***.  Veins are tender to palpation  Mild venous stasis changes to the legs bilaterally.  Trace soft pitting edema CEAP C3sEpAsPr Vessel Right Left  Radial Palpable Palpable  Gastrointestinal: soft, non-distended. No guarding/no peritoneal signs.  Musculoskeletal: M/S 5/5 throughout.  No deformity.  Neurologic: CN 2-12 intact. Pain and light touch intact in extremities.  Symmetrical.  Speech is fluent. Motor exam as listed above. Psychiatric: Judgment intact, Mood & affect appropriate for pt's clinical situation. Dermatologic: Venous rashes no ulcers noted.  No changes consistent with cellulitis. Lymph : No lichenification or skin changes of chronic lymphedema.  CBC Lab Results  Component Value Date   WBC 8.0 04/09/2023   HGB 10.2 (L) 04/09/2023   HCT 29.7 (L) 04/09/2023   MCV 86.1 04/09/2023   PLT 274 04/09/2023    BMET    Component Value Date/Time   NA 138 04/09/2023 1526   K 3.8 04/09/2023 1526   CL 102 04/09/2023 1526   CO2 27 04/09/2023 1526   GLUCOSE 121 (H) 04/09/2023 1526   BUN 11 04/09/2023 1526   CREATININE 0.44 04/09/2023 1526   CALCIUM 8.7 (L) 04/09/2023 1526   GFRNONAA >60 04/09/2023 1526   GFRAA >60 01/18/2019 2145   CrCl cannot be calculated (Patient's most recent lab result is older than the maximum 21 days allowed.).  COAG Lab Results  Component Value Date   INR 1.1 04/09/2023   INR 1.1 03/01/2023    Radiology No results found.   Assessment/Plan There are no diagnoses linked to this encounter.   Levora Dredge, MD  07/06/2023 3:32 PM

## 2023-07-08 ENCOUNTER — Ambulatory Visit (INDEPENDENT_AMBULATORY_CARE_PROVIDER_SITE_OTHER): Payer: Medicaid Other | Admitting: Vascular Surgery

## 2023-07-08 ENCOUNTER — Encounter (INDEPENDENT_AMBULATORY_CARE_PROVIDER_SITE_OTHER): Payer: Self-pay | Admitting: Vascular Surgery

## 2023-07-08 ENCOUNTER — Ambulatory Visit (INDEPENDENT_AMBULATORY_CARE_PROVIDER_SITE_OTHER): Payer: BLUE CROSS/BLUE SHIELD

## 2023-07-08 VITALS — BP 140/91 | HR 98 | Resp 16 | Wt 222.0 lb

## 2023-07-08 DIAGNOSIS — I8310 Varicose veins of unspecified lower extremity with inflammation: Secondary | ICD-10-CM | POA: Diagnosis not present

## 2023-07-08 DIAGNOSIS — I83893 Varicose veins of bilateral lower extremities with other complications: Secondary | ICD-10-CM | POA: Diagnosis not present

## 2023-07-08 DIAGNOSIS — I872 Venous insufficiency (chronic) (peripheral): Secondary | ICD-10-CM

## 2023-12-19 ENCOUNTER — Telehealth (INDEPENDENT_AMBULATORY_CARE_PROVIDER_SITE_OTHER): Payer: Self-pay | Admitting: Vascular Surgery

## 2023-12-19 NOTE — Telephone Encounter (Signed)
 ATC pt to discuss insurance for prior auth for sclerotherapy. No VM set up and no answer.

## 2024-03-23 ENCOUNTER — Ambulatory Visit (INDEPENDENT_AMBULATORY_CARE_PROVIDER_SITE_OTHER): Admitting: Vascular Surgery

## 2024-03-23 NOTE — Progress Notes (Deleted)
 MRN : [id number]  [name] Emily Ruiz is a 33 y.o. (1990/11/05) female who presents with chief complaint of varicose veins hurt.  History of Present Illness:   The patient is seen for evaluation of symptomatic varicose veins. The patient relates burning and stinging which worsened steadily throughout the course of the day, particularly with standing. The patient also notes an aching and throbbing pain over the varicosities, particularly with prolonged dependent positions. The symptoms are significantly improved with elevation.  The patient also notes that during hot weather the symptoms are greatly intensified. The patient states the pain from the varicose veins interferes with work, daily exercise, shopping and household maintenance. At this point, the symptoms are persistent and severe enough that they're having a negative impact on lifestyle and are interfering with daily activities.  There is no history of DVT, PE or superficial thrombophlebitis. There is no history of ulceration or hemorrhage. The patient denies a significant family history of varicose veins.  The patient has not worn graduated compression in the past. At the present time the patient has not been using over-the-counter analgesics. There is no history of prior surgical intervention or sclerotherapy.   No outpatient medications have been marked as taking for the 03/23/24 encounter (Appointment) with Jama, Cordella MATSU, MD.    Past Medical History:  Diagnosis Date   Cesarean delivery delivered 04/09/2017   Chorioamnionitis 04/09/2017   Gastritis    GERD (gastroesophageal reflux disease)     Past Surgical History:  Procedure Laterality Date   CESAREAN SECTION N/A 04/09/2017   Procedure: CESAREAN SECTION;  Surgeon: Victor Claudell SAUNDERS, MD;  Location: [facility];  Service: Obstetrics;  Laterality: N/A;   CESAREAN SECTION  03/01/2023   Procedure: CESAREAN SECTION;  Surgeon: Verdon Keen, MD;   Location: [facility];  Service: Obstetrics;;   DILATION AND EVACUATION N/A 04/09/2023   Procedure: SUCTION DILATATION AND EVACUATION;  Surgeon: Verdon Keen, MD;  Location: [facility];  Service: Gynecology;  Laterality: N/A;    Social History Social History   Tobacco Use   Smoking status: Never   Smokeless tobacco: Never  Vaping Use   Vaping status: Never Used  Substance Use Topics   Alcohol use: No   Drug use: No    Family History Family History  Problem Relation Age of Onset   Hypertension Mother    Varicose Veins Mother    Breast cancer Neg Hx     No Known Allergies   REVIEW OF SYSTEMS (Negative unless checked)  Constitutional: [] Weight loss  [] Fever  [] Chills Cardiac: [] Chest pain   [] Chest pressure   [] Palpitations   [] Shortness of breath when laying flat   [] Shortness of breath with exertion. Vascular:  [] Pain in legs with walking   [x] Pain in legs with standing  [] History of DVT   [] Phlebitis   [] Swelling in legs   [x] Varicose veins   [] Non-healing ulcers Pulmonary:   [] Uses home oxygen   [] Productive cough   [] Hemoptysis   [] Wheeze  [] COPD   [] Asthma Neurologic:  [] Dizziness   [] Seizures   [] History of stroke   [] History of TIA  [] Aphasia   [] Vissual changes   [] Weakness or numbness in arm   [] Weakness or numbness in leg Musculoskeletal:   [] Joint swelling   [] Joint pain   [] Low back pain Hematologic:  [] Easy bruising  [] Easy bleeding   [] Hypercoagulable state   [] Anemic Gastrointestinal:  [] Diarrhea   []   Vomiting  [] Gastroesophageal reflux/heartburn   [] Difficulty swallowing. Genitourinary:  [] Chronic kidney disease   [] Difficult urination  [] Frequent urination   [] Blood in urine Skin:  [] Rashes   [] Ulcers  Psychological:  [] History of anxiety   []  History of major depression.  Physical Examination  There were no vitals filed for this visit. There is no height or weight on file to calculate BMI. Gen: WD/WN, NAD Head: [place]/AT, No temporalis wasting.   Ear/Nose/Throat: Hearing grossly intact, nares w/o erythema or drainage, pinna without lesions Eyes: PER, EOMI, sclera nonicteric.  Neck: Supple, no gross masses.  No JVD.  Pulmonary:  Good air movement, no audible wheezing, no use of accessory muscles.  Cardiac: RRR, precordium not hyperdynamic. Vascular:  Large varicosities present, greater than 10 mm ***.  Veins are tender to palpation  Mild venous stasis changes to the legs bilaterally.  Trace soft pitting edema CEAP C3sEpAsPr Vessel Right Left  Radial Palpable Palpable  Gastrointestinal: soft, non-distended. No guarding/no peritoneal signs.  Musculoskeletal: M/S 5/5 throughout.  No deformity.  Neurologic: CN 2-12 intact. Pain and light touch intact in extremities.  Symmetrical.  Speech is fluent. Motor exam as listed above. Psychiatric: Judgment intact, Mood & affect appropriate for pt's clinical situation. Dermatologic: Venous rashes no ulcers noted.  No changes consistent with cellulitis. Lymph : No lichenification or skin changes of chronic lymphedema.  CBC Lab Results  Component Value Date   WBC 8.0 04/09/2023   HGB 10.2 (L) 04/09/2023   HCT 29.7 (L) 04/09/2023   MCV 86.1 04/09/2023   PLT 274 04/09/2023    BMET    Component Value Date/Time   NA 138 04/09/2023 1526   K 3.8 04/09/2023 1526   CL 102 04/09/2023 1526   CO2 27 04/09/2023 1526   GLUCOSE 121 (H) 04/09/2023 1526   BUN 11 04/09/2023 1526   CREATININE 0.44 04/09/2023 1526   CALCIUM 8.7 (L) 04/09/2023 1526   GFRNONAA >60 04/09/2023 1526   GFRAA >60 01/18/2019 2145   CrCl cannot be calculated (Patient's most recent lab result is older than the maximum 21 days allowed.).  COAG Lab Results  Component Value Date   INR 1.1 04/09/2023   INR 1.1 03/01/2023    Radiology No results found.   Assessment/Plan There are no diagnoses linked to this encounter.   Cordella Shawl, MD  03/23/2024 8:39 AM
